# Patient Record
Sex: Male | Born: 1945 | Race: White | Hispanic: No | Marital: Married | Smoking: Never smoker
Health system: Southern US, Community
[De-identification: ages and names within clinical notes are randomized; demographics above are authoritative.]

## PROBLEM LIST (undated history)

## (undated) DIAGNOSIS — R7303 Prediabetes: Secondary | ICD-10-CM

## (undated) DIAGNOSIS — Z87898 Personal history of other specified conditions: Secondary | ICD-10-CM

## (undated) DIAGNOSIS — E7800 Pure hypercholesterolemia, unspecified: Secondary | ICD-10-CM

## (undated) DIAGNOSIS — N401 Enlarged prostate with lower urinary tract symptoms: Secondary | ICD-10-CM

## (undated) DIAGNOSIS — I10 Essential (primary) hypertension: Secondary | ICD-10-CM

## (undated) DIAGNOSIS — Z86010 Personal history of colonic polyps: Secondary | ICD-10-CM

## (undated) DIAGNOSIS — K5790 Diverticulosis of intestine, part unspecified, without perforation or abscess without bleeding: Secondary | ICD-10-CM

## (undated) DIAGNOSIS — H409 Unspecified glaucoma: Secondary | ICD-10-CM

## (undated) DIAGNOSIS — J4520 Mild intermittent asthma, uncomplicated: Secondary | ICD-10-CM

## (undated) DIAGNOSIS — K219 Gastro-esophageal reflux disease without esophagitis: Secondary | ICD-10-CM

## (undated) DIAGNOSIS — Z860101 Personal history of adenomatous and serrated colon polyps: Secondary | ICD-10-CM

## (undated) DIAGNOSIS — Z85820 Personal history of malignant melanoma of skin: Secondary | ICD-10-CM

## (undated) DIAGNOSIS — Z973 Presence of spectacles and contact lenses: Secondary | ICD-10-CM

## (undated) HISTORY — DX: Prediabetes: R73.03

## (undated) HISTORY — DX: Pure hypercholesterolemia, unspecified: E78.00

## (undated) HISTORY — DX: Essential (primary) hypertension: I10

## (undated) HISTORY — DX: Gastro-esophageal reflux disease without esophagitis: K21.9

## (undated) HISTORY — DX: Diverticulosis of intestine, part unspecified, without perforation or abscess without bleeding: K57.90

---

## 1996-08-23 DIAGNOSIS — Z85820 Personal history of malignant melanoma of skin: Secondary | ICD-10-CM

## 1996-08-23 HISTORY — DX: Personal history of malignant melanoma of skin: Z85.820

## 1996-08-23 HISTORY — PX: MELANOMA EXCISION WITH SENTINEL LYMPH NODE BIOPSY: SHX5267

## 1998-06-22 ENCOUNTER — Emergency Department (HOSPITAL_COMMUNITY): Admission: EM | Admit: 1998-06-22 | Discharge: 1998-06-22 | Payer: Self-pay | Admitting: Emergency Medicine

## 2000-05-16 ENCOUNTER — Encounter: Payer: Self-pay | Admitting: General Surgery

## 2000-05-16 ENCOUNTER — Encounter: Admission: RE | Admit: 2000-05-16 | Discharge: 2000-05-16 | Payer: Self-pay | Admitting: General Surgery

## 2001-05-26 ENCOUNTER — Encounter: Payer: Self-pay | Admitting: General Surgery

## 2001-05-26 ENCOUNTER — Encounter: Admission: RE | Admit: 2001-05-26 | Discharge: 2001-05-26 | Payer: Self-pay | Admitting: General Surgery

## 2002-05-28 ENCOUNTER — Encounter: Payer: Self-pay | Admitting: Family Medicine

## 2002-05-28 ENCOUNTER — Encounter: Admission: RE | Admit: 2002-05-28 | Discharge: 2002-05-28 | Payer: Self-pay | Admitting: Family Medicine

## 2003-12-13 ENCOUNTER — Ambulatory Visit (HOSPITAL_COMMUNITY): Admission: RE | Admit: 2003-12-13 | Discharge: 2003-12-13 | Payer: Self-pay | Admitting: Gastroenterology

## 2005-04-01 ENCOUNTER — Encounter: Admission: RE | Admit: 2005-04-01 | Discharge: 2005-04-01 | Payer: Self-pay | Admitting: Family Medicine

## 2006-05-16 ENCOUNTER — Encounter: Admission: RE | Admit: 2006-05-16 | Discharge: 2006-05-16 | Payer: Self-pay | Admitting: Family Medicine

## 2009-08-23 HISTORY — PX: COLONOSCOPY: SHX174

## 2011-12-20 DIAGNOSIS — H409 Unspecified glaucoma: Secondary | ICD-10-CM | POA: Diagnosis not present

## 2011-12-20 DIAGNOSIS — H4011X0 Primary open-angle glaucoma, stage unspecified: Secondary | ICD-10-CM | POA: Diagnosis not present

## 2011-12-30 DIAGNOSIS — H4011X0 Primary open-angle glaucoma, stage unspecified: Secondary | ICD-10-CM | POA: Diagnosis not present

## 2012-01-18 DIAGNOSIS — H409 Unspecified glaucoma: Secondary | ICD-10-CM | POA: Diagnosis not present

## 2012-01-18 DIAGNOSIS — H4011X0 Primary open-angle glaucoma, stage unspecified: Secondary | ICD-10-CM | POA: Diagnosis not present

## 2012-01-24 DIAGNOSIS — H4011X0 Primary open-angle glaucoma, stage unspecified: Secondary | ICD-10-CM | POA: Diagnosis not present

## 2012-02-03 DIAGNOSIS — H409 Unspecified glaucoma: Secondary | ICD-10-CM | POA: Diagnosis not present

## 2012-02-03 DIAGNOSIS — H4011X0 Primary open-angle glaucoma, stage unspecified: Secondary | ICD-10-CM | POA: Diagnosis not present

## 2012-02-23 DIAGNOSIS — H9190 Unspecified hearing loss, unspecified ear: Secondary | ICD-10-CM | POA: Diagnosis not present

## 2012-03-07 DIAGNOSIS — H1045 Other chronic allergic conjunctivitis: Secondary | ICD-10-CM | POA: Diagnosis not present

## 2012-03-07 DIAGNOSIS — H4011X0 Primary open-angle glaucoma, stage unspecified: Secondary | ICD-10-CM | POA: Diagnosis not present

## 2012-03-07 DIAGNOSIS — H01009 Unspecified blepharitis unspecified eye, unspecified eyelid: Secondary | ICD-10-CM | POA: Diagnosis not present

## 2012-03-07 DIAGNOSIS — H2510 Age-related nuclear cataract, unspecified eye: Secondary | ICD-10-CM | POA: Diagnosis not present

## 2012-03-16 DIAGNOSIS — L821 Other seborrheic keratosis: Secondary | ICD-10-CM | POA: Diagnosis not present

## 2012-03-16 DIAGNOSIS — L570 Actinic keratosis: Secondary | ICD-10-CM | POA: Diagnosis not present

## 2012-03-16 DIAGNOSIS — Z85820 Personal history of malignant melanoma of skin: Secondary | ICD-10-CM | POA: Diagnosis not present

## 2012-03-24 DIAGNOSIS — H409 Unspecified glaucoma: Secondary | ICD-10-CM | POA: Diagnosis not present

## 2012-03-24 DIAGNOSIS — Z125 Encounter for screening for malignant neoplasm of prostate: Secondary | ICD-10-CM | POA: Diagnosis not present

## 2012-03-24 DIAGNOSIS — Z0000 Encounter for general adult medical examination without abnormal findings: Secondary | ICD-10-CM | POA: Diagnosis not present

## 2012-03-24 DIAGNOSIS — Z79899 Other long term (current) drug therapy: Secondary | ICD-10-CM | POA: Diagnosis not present

## 2012-03-24 DIAGNOSIS — E7800 Pure hypercholesterolemia, unspecified: Secondary | ICD-10-CM | POA: Diagnosis not present

## 2012-03-24 DIAGNOSIS — I10 Essential (primary) hypertension: Secondary | ICD-10-CM | POA: Diagnosis not present

## 2012-03-24 DIAGNOSIS — R350 Frequency of micturition: Secondary | ICD-10-CM | POA: Diagnosis not present

## 2012-03-24 DIAGNOSIS — Z23 Encounter for immunization: Secondary | ICD-10-CM | POA: Diagnosis not present

## 2012-07-05 DIAGNOSIS — H4011X0 Primary open-angle glaucoma, stage unspecified: Secondary | ICD-10-CM | POA: Diagnosis not present

## 2012-07-05 DIAGNOSIS — H409 Unspecified glaucoma: Secondary | ICD-10-CM | POA: Diagnosis not present

## 2012-07-05 DIAGNOSIS — H2510 Age-related nuclear cataract, unspecified eye: Secondary | ICD-10-CM | POA: Diagnosis not present

## 2012-08-09 DIAGNOSIS — H4011X0 Primary open-angle glaucoma, stage unspecified: Secondary | ICD-10-CM | POA: Diagnosis not present

## 2012-08-31 DIAGNOSIS — H4011X0 Primary open-angle glaucoma, stage unspecified: Secondary | ICD-10-CM | POA: Diagnosis not present

## 2012-09-21 DIAGNOSIS — H1045 Other chronic allergic conjunctivitis: Secondary | ICD-10-CM | POA: Diagnosis not present

## 2012-09-21 DIAGNOSIS — D485 Neoplasm of uncertain behavior of skin: Secondary | ICD-10-CM | POA: Diagnosis not present

## 2012-09-21 DIAGNOSIS — H2510 Age-related nuclear cataract, unspecified eye: Secondary | ICD-10-CM | POA: Diagnosis not present

## 2012-09-21 DIAGNOSIS — Z85820 Personal history of malignant melanoma of skin: Secondary | ICD-10-CM | POA: Diagnosis not present

## 2012-09-21 DIAGNOSIS — H01009 Unspecified blepharitis unspecified eye, unspecified eyelid: Secondary | ICD-10-CM | POA: Diagnosis not present

## 2012-09-21 DIAGNOSIS — L570 Actinic keratosis: Secondary | ICD-10-CM | POA: Diagnosis not present

## 2012-09-21 DIAGNOSIS — H4011X0 Primary open-angle glaucoma, stage unspecified: Secondary | ICD-10-CM | POA: Diagnosis not present

## 2012-09-21 DIAGNOSIS — L821 Other seborrheic keratosis: Secondary | ICD-10-CM | POA: Diagnosis not present

## 2012-09-27 DIAGNOSIS — R350 Frequency of micturition: Secondary | ICD-10-CM | POA: Diagnosis not present

## 2012-09-27 DIAGNOSIS — R05 Cough: Secondary | ICD-10-CM | POA: Diagnosis not present

## 2012-09-27 DIAGNOSIS — E7800 Pure hypercholesterolemia, unspecified: Secondary | ICD-10-CM | POA: Diagnosis not present

## 2012-09-27 DIAGNOSIS — Z79899 Other long term (current) drug therapy: Secondary | ICD-10-CM | POA: Diagnosis not present

## 2012-09-27 DIAGNOSIS — I10 Essential (primary) hypertension: Secondary | ICD-10-CM | POA: Diagnosis not present

## 2012-09-27 DIAGNOSIS — H9209 Otalgia, unspecified ear: Secondary | ICD-10-CM | POA: Diagnosis not present

## 2012-10-12 DIAGNOSIS — R7301 Impaired fasting glucose: Secondary | ICD-10-CM | POA: Diagnosis not present

## 2013-01-19 DIAGNOSIS — H4011X0 Primary open-angle glaucoma, stage unspecified: Secondary | ICD-10-CM | POA: Diagnosis not present

## 2013-01-19 DIAGNOSIS — H2510 Age-related nuclear cataract, unspecified eye: Secondary | ICD-10-CM | POA: Diagnosis not present

## 2013-01-19 DIAGNOSIS — H01009 Unspecified blepharitis unspecified eye, unspecified eyelid: Secondary | ICD-10-CM | POA: Diagnosis not present

## 2013-03-21 DIAGNOSIS — Z85828 Personal history of other malignant neoplasm of skin: Secondary | ICD-10-CM | POA: Diagnosis not present

## 2013-03-21 DIAGNOSIS — L821 Other seborrheic keratosis: Secondary | ICD-10-CM | POA: Diagnosis not present

## 2013-03-21 DIAGNOSIS — D1801 Hemangioma of skin and subcutaneous tissue: Secondary | ICD-10-CM | POA: Diagnosis not present

## 2013-03-21 DIAGNOSIS — L570 Actinic keratosis: Secondary | ICD-10-CM | POA: Diagnosis not present

## 2013-03-21 DIAGNOSIS — Z85820 Personal history of malignant melanoma of skin: Secondary | ICD-10-CM | POA: Diagnosis not present

## 2013-03-27 DIAGNOSIS — E7800 Pure hypercholesterolemia, unspecified: Secondary | ICD-10-CM | POA: Diagnosis not present

## 2013-03-27 DIAGNOSIS — Z125 Encounter for screening for malignant neoplasm of prostate: Secondary | ICD-10-CM | POA: Diagnosis not present

## 2013-03-27 DIAGNOSIS — Z79899 Other long term (current) drug therapy: Secondary | ICD-10-CM | POA: Diagnosis not present

## 2013-03-27 DIAGNOSIS — R7309 Other abnormal glucose: Secondary | ICD-10-CM | POA: Diagnosis not present

## 2013-03-27 DIAGNOSIS — Z0000 Encounter for general adult medical examination without abnormal findings: Secondary | ICD-10-CM | POA: Diagnosis not present

## 2013-03-27 DIAGNOSIS — I10 Essential (primary) hypertension: Secondary | ICD-10-CM | POA: Diagnosis not present

## 2013-03-27 DIAGNOSIS — Z86010 Personal history of colonic polyps: Secondary | ICD-10-CM | POA: Diagnosis not present

## 2013-05-30 DIAGNOSIS — H2510 Age-related nuclear cataract, unspecified eye: Secondary | ICD-10-CM | POA: Diagnosis not present

## 2013-05-30 DIAGNOSIS — H4011X0 Primary open-angle glaucoma, stage unspecified: Secondary | ICD-10-CM | POA: Diagnosis not present

## 2013-07-16 ENCOUNTER — Ambulatory Visit
Admission: RE | Admit: 2013-07-16 | Discharge: 2013-07-16 | Disposition: A | Discharge status: Home / Self Care | Payer: Medicare Other | Source: Ambulatory Visit | Attending: Family Medicine | Admitting: Family Medicine

## 2013-07-16 ENCOUNTER — Other Ambulatory Visit: Payer: Self-pay | Admitting: Family Medicine

## 2013-07-16 DIAGNOSIS — R05 Cough: Secondary | ICD-10-CM

## 2013-07-30 DIAGNOSIS — I10 Essential (primary) hypertension: Secondary | ICD-10-CM | POA: Diagnosis not present

## 2013-07-30 DIAGNOSIS — R05 Cough: Secondary | ICD-10-CM | POA: Diagnosis not present

## 2013-08-10 DIAGNOSIS — I10 Essential (primary) hypertension: Secondary | ICD-10-CM | POA: Diagnosis not present

## 2013-08-10 DIAGNOSIS — R05 Cough: Secondary | ICD-10-CM | POA: Diagnosis not present

## 2013-09-20 DIAGNOSIS — L570 Actinic keratosis: Secondary | ICD-10-CM | POA: Diagnosis not present

## 2013-09-20 DIAGNOSIS — Z85828 Personal history of other malignant neoplasm of skin: Secondary | ICD-10-CM | POA: Diagnosis not present

## 2013-09-20 DIAGNOSIS — L820 Inflamed seborrheic keratosis: Secondary | ICD-10-CM | POA: Diagnosis not present

## 2013-09-20 DIAGNOSIS — D1739 Benign lipomatous neoplasm of skin and subcutaneous tissue of other sites: Secondary | ICD-10-CM | POA: Diagnosis not present

## 2013-09-20 DIAGNOSIS — L821 Other seborrheic keratosis: Secondary | ICD-10-CM | POA: Diagnosis not present

## 2013-09-20 DIAGNOSIS — D1801 Hemangioma of skin and subcutaneous tissue: Secondary | ICD-10-CM | POA: Diagnosis not present

## 2013-09-20 DIAGNOSIS — Z85820 Personal history of malignant melanoma of skin: Secondary | ICD-10-CM | POA: Diagnosis not present

## 2013-10-01 DIAGNOSIS — R7309 Other abnormal glucose: Secondary | ICD-10-CM | POA: Diagnosis not present

## 2013-10-01 DIAGNOSIS — H409 Unspecified glaucoma: Secondary | ICD-10-CM | POA: Diagnosis not present

## 2013-10-01 DIAGNOSIS — H9209 Otalgia, unspecified ear: Secondary | ICD-10-CM | POA: Diagnosis not present

## 2013-10-01 DIAGNOSIS — Z79899 Other long term (current) drug therapy: Secondary | ICD-10-CM | POA: Diagnosis not present

## 2013-10-01 DIAGNOSIS — E7800 Pure hypercholesterolemia, unspecified: Secondary | ICD-10-CM | POA: Diagnosis not present

## 2013-10-01 DIAGNOSIS — K219 Gastro-esophageal reflux disease without esophagitis: Secondary | ICD-10-CM | POA: Diagnosis not present

## 2013-10-01 DIAGNOSIS — I10 Essential (primary) hypertension: Secondary | ICD-10-CM | POA: Diagnosis not present

## 2013-10-05 DIAGNOSIS — H4011X0 Primary open-angle glaucoma, stage unspecified: Secondary | ICD-10-CM | POA: Diagnosis not present

## 2013-11-16 DIAGNOSIS — H2510 Age-related nuclear cataract, unspecified eye: Secondary | ICD-10-CM | POA: Diagnosis not present

## 2013-11-16 DIAGNOSIS — H4011X0 Primary open-angle glaucoma, stage unspecified: Secondary | ICD-10-CM | POA: Diagnosis not present

## 2013-12-14 DIAGNOSIS — H4011X0 Primary open-angle glaucoma, stage unspecified: Secondary | ICD-10-CM | POA: Diagnosis not present

## 2014-01-11 DIAGNOSIS — H2510 Age-related nuclear cataract, unspecified eye: Secondary | ICD-10-CM | POA: Diagnosis not present

## 2014-01-11 DIAGNOSIS — H4011X0 Primary open-angle glaucoma, stage unspecified: Secondary | ICD-10-CM | POA: Diagnosis not present

## 2014-03-27 DIAGNOSIS — Z85828 Personal history of other malignant neoplasm of skin: Secondary | ICD-10-CM | POA: Diagnosis not present

## 2014-03-27 DIAGNOSIS — L570 Actinic keratosis: Secondary | ICD-10-CM | POA: Diagnosis not present

## 2014-03-27 DIAGNOSIS — L678 Other hair color and hair shaft abnormalities: Secondary | ICD-10-CM | POA: Diagnosis not present

## 2014-03-27 DIAGNOSIS — D1739 Benign lipomatous neoplasm of skin and subcutaneous tissue of other sites: Secondary | ICD-10-CM | POA: Diagnosis not present

## 2014-03-27 DIAGNOSIS — L738 Other specified follicular disorders: Secondary | ICD-10-CM | POA: Diagnosis not present

## 2014-03-27 DIAGNOSIS — L821 Other seborrheic keratosis: Secondary | ICD-10-CM | POA: Diagnosis not present

## 2014-03-27 DIAGNOSIS — Q828 Other specified congenital malformations of skin: Secondary | ICD-10-CM | POA: Diagnosis not present

## 2014-03-27 DIAGNOSIS — Z85820 Personal history of malignant melanoma of skin: Secondary | ICD-10-CM | POA: Diagnosis not present

## 2014-04-22 DIAGNOSIS — I10 Essential (primary) hypertension: Secondary | ICD-10-CM | POA: Diagnosis not present

## 2014-04-22 DIAGNOSIS — E7800 Pure hypercholesterolemia, unspecified: Secondary | ICD-10-CM | POA: Diagnosis not present

## 2014-04-22 DIAGNOSIS — H409 Unspecified glaucoma: Secondary | ICD-10-CM | POA: Diagnosis not present

## 2014-04-22 DIAGNOSIS — R7309 Other abnormal glucose: Secondary | ICD-10-CM | POA: Diagnosis not present

## 2014-04-22 DIAGNOSIS — Z1331 Encounter for screening for depression: Secondary | ICD-10-CM | POA: Diagnosis not present

## 2014-04-22 DIAGNOSIS — K219 Gastro-esophageal reflux disease without esophagitis: Secondary | ICD-10-CM | POA: Diagnosis not present

## 2014-05-14 DIAGNOSIS — H4011X0 Primary open-angle glaucoma, stage unspecified: Secondary | ICD-10-CM | POA: Diagnosis not present

## 2014-05-14 DIAGNOSIS — H2510 Age-related nuclear cataract, unspecified eye: Secondary | ICD-10-CM | POA: Diagnosis not present

## 2014-10-01 DIAGNOSIS — H2513 Age-related nuclear cataract, bilateral: Secondary | ICD-10-CM | POA: Diagnosis not present

## 2014-10-01 DIAGNOSIS — H4011X2 Primary open-angle glaucoma, moderate stage: Secondary | ICD-10-CM | POA: Diagnosis not present

## 2014-10-01 DIAGNOSIS — H4011X3 Primary open-angle glaucoma, severe stage: Secondary | ICD-10-CM | POA: Diagnosis not present

## 2014-10-01 DIAGNOSIS — H40033 Anatomical narrow angle, bilateral: Secondary | ICD-10-CM | POA: Diagnosis not present

## 2014-10-03 DIAGNOSIS — L570 Actinic keratosis: Secondary | ICD-10-CM | POA: Diagnosis not present

## 2014-10-03 DIAGNOSIS — Z85820 Personal history of malignant melanoma of skin: Secondary | ICD-10-CM | POA: Diagnosis not present

## 2014-10-03 DIAGNOSIS — Z85828 Personal history of other malignant neoplasm of skin: Secondary | ICD-10-CM | POA: Diagnosis not present

## 2014-10-03 DIAGNOSIS — L821 Other seborrheic keratosis: Secondary | ICD-10-CM | POA: Diagnosis not present

## 2014-10-03 DIAGNOSIS — D1721 Benign lipomatous neoplasm of skin and subcutaneous tissue of right arm: Secondary | ICD-10-CM | POA: Diagnosis not present

## 2014-10-22 DIAGNOSIS — H409 Unspecified glaucoma: Secondary | ICD-10-CM | POA: Diagnosis not present

## 2014-10-22 DIAGNOSIS — R7309 Other abnormal glucose: Secondary | ICD-10-CM | POA: Diagnosis not present

## 2014-10-22 DIAGNOSIS — K219 Gastro-esophageal reflux disease without esophagitis: Secondary | ICD-10-CM | POA: Diagnosis not present

## 2014-10-22 DIAGNOSIS — E780 Pure hypercholesterolemia: Secondary | ICD-10-CM | POA: Diagnosis not present

## 2014-10-22 DIAGNOSIS — H6120 Impacted cerumen, unspecified ear: Secondary | ICD-10-CM | POA: Diagnosis not present

## 2014-10-22 DIAGNOSIS — I10 Essential (primary) hypertension: Secondary | ICD-10-CM | POA: Diagnosis not present

## 2014-10-22 DIAGNOSIS — Z0000 Encounter for general adult medical examination without abnormal findings: Secondary | ICD-10-CM | POA: Diagnosis not present

## 2014-10-22 DIAGNOSIS — Z86010 Personal history of colonic polyps: Secondary | ICD-10-CM | POA: Diagnosis not present

## 2014-10-22 DIAGNOSIS — Z125 Encounter for screening for malignant neoplasm of prostate: Secondary | ICD-10-CM | POA: Diagnosis not present

## 2014-10-22 DIAGNOSIS — Z23 Encounter for immunization: Secondary | ICD-10-CM | POA: Diagnosis not present

## 2014-10-22 DIAGNOSIS — Z1389 Encounter for screening for other disorder: Secondary | ICD-10-CM | POA: Diagnosis not present

## 2015-01-30 DIAGNOSIS — H4011X3 Primary open-angle glaucoma, severe stage: Secondary | ICD-10-CM | POA: Diagnosis not present

## 2015-01-30 DIAGNOSIS — H2513 Age-related nuclear cataract, bilateral: Secondary | ICD-10-CM | POA: Diagnosis not present

## 2015-04-15 DIAGNOSIS — L570 Actinic keratosis: Secondary | ICD-10-CM | POA: Diagnosis not present

## 2015-04-15 DIAGNOSIS — L821 Other seborrheic keratosis: Secondary | ICD-10-CM | POA: Diagnosis not present

## 2015-04-15 DIAGNOSIS — D1721 Benign lipomatous neoplasm of skin and subcutaneous tissue of right arm: Secondary | ICD-10-CM | POA: Diagnosis not present

## 2015-04-15 DIAGNOSIS — D1801 Hemangioma of skin and subcutaneous tissue: Secondary | ICD-10-CM | POA: Diagnosis not present

## 2015-04-15 DIAGNOSIS — Z85820 Personal history of malignant melanoma of skin: Secondary | ICD-10-CM | POA: Diagnosis not present

## 2015-04-15 DIAGNOSIS — Z85828 Personal history of other malignant neoplasm of skin: Secondary | ICD-10-CM | POA: Diagnosis not present

## 2015-05-08 DIAGNOSIS — I10 Essential (primary) hypertension: Secondary | ICD-10-CM | POA: Diagnosis not present

## 2015-05-08 DIAGNOSIS — R7309 Other abnormal glucose: Secondary | ICD-10-CM | POA: Diagnosis not present

## 2015-05-08 DIAGNOSIS — E780 Pure hypercholesterolemia: Secondary | ICD-10-CM | POA: Diagnosis not present

## 2015-05-08 DIAGNOSIS — Z86010 Personal history of colonic polyps: Secondary | ICD-10-CM | POA: Diagnosis not present

## 2015-05-08 DIAGNOSIS — K219 Gastro-esophageal reflux disease without esophagitis: Secondary | ICD-10-CM | POA: Diagnosis not present

## 2015-05-08 DIAGNOSIS — H409 Unspecified glaucoma: Secondary | ICD-10-CM | POA: Diagnosis not present

## 2015-06-03 DIAGNOSIS — H401123 Primary open-angle glaucoma, left eye, severe stage: Secondary | ICD-10-CM | POA: Diagnosis not present

## 2015-06-03 DIAGNOSIS — H401112 Primary open-angle glaucoma, right eye, moderate stage: Secondary | ICD-10-CM | POA: Diagnosis not present

## 2015-07-11 DIAGNOSIS — Z86010 Personal history of colonic polyps: Secondary | ICD-10-CM | POA: Diagnosis not present

## 2015-07-11 DIAGNOSIS — K648 Other hemorrhoids: Secondary | ICD-10-CM | POA: Diagnosis not present

## 2015-07-11 DIAGNOSIS — Z09 Encounter for follow-up examination after completed treatment for conditions other than malignant neoplasm: Secondary | ICD-10-CM | POA: Diagnosis not present

## 2015-10-08 DIAGNOSIS — H401112 Primary open-angle glaucoma, right eye, moderate stage: Secondary | ICD-10-CM | POA: Diagnosis not present

## 2015-10-08 DIAGNOSIS — H2513 Age-related nuclear cataract, bilateral: Secondary | ICD-10-CM | POA: Diagnosis not present

## 2015-10-08 DIAGNOSIS — H401123 Primary open-angle glaucoma, left eye, severe stage: Secondary | ICD-10-CM | POA: Diagnosis not present

## 2015-10-28 DIAGNOSIS — L570 Actinic keratosis: Secondary | ICD-10-CM | POA: Diagnosis not present

## 2015-10-28 DIAGNOSIS — Z85828 Personal history of other malignant neoplasm of skin: Secondary | ICD-10-CM | POA: Diagnosis not present

## 2015-10-28 DIAGNOSIS — D485 Neoplasm of uncertain behavior of skin: Secondary | ICD-10-CM | POA: Diagnosis not present

## 2015-10-28 DIAGNOSIS — D1801 Hemangioma of skin and subcutaneous tissue: Secondary | ICD-10-CM | POA: Diagnosis not present

## 2015-10-28 DIAGNOSIS — L821 Other seborrheic keratosis: Secondary | ICD-10-CM | POA: Diagnosis not present

## 2015-10-28 DIAGNOSIS — Z85820 Personal history of malignant melanoma of skin: Secondary | ICD-10-CM | POA: Diagnosis not present

## 2015-10-28 DIAGNOSIS — L820 Inflamed seborrheic keratosis: Secondary | ICD-10-CM | POA: Diagnosis not present

## 2015-10-28 DIAGNOSIS — D1721 Benign lipomatous neoplasm of skin and subcutaneous tissue of right arm: Secondary | ICD-10-CM | POA: Diagnosis not present

## 2015-11-05 DIAGNOSIS — R7303 Prediabetes: Secondary | ICD-10-CM | POA: Diagnosis not present

## 2015-11-05 DIAGNOSIS — H409 Unspecified glaucoma: Secondary | ICD-10-CM | POA: Diagnosis not present

## 2015-11-05 DIAGNOSIS — E7800 Pure hypercholesterolemia, unspecified: Secondary | ICD-10-CM | POA: Diagnosis not present

## 2015-11-05 DIAGNOSIS — I10 Essential (primary) hypertension: Secondary | ICD-10-CM | POA: Diagnosis not present

## 2015-11-05 DIAGNOSIS — R7309 Other abnormal glucose: Secondary | ICD-10-CM | POA: Diagnosis not present

## 2015-11-05 DIAGNOSIS — K219 Gastro-esophageal reflux disease without esophagitis: Secondary | ICD-10-CM | POA: Diagnosis not present

## 2015-12-12 ENCOUNTER — Encounter: Payer: Self-pay | Admitting: Podiatry

## 2015-12-12 ENCOUNTER — Ambulatory Visit (INDEPENDENT_AMBULATORY_CARE_PROVIDER_SITE_OTHER): Payer: Medicare Other | Admitting: Podiatry

## 2015-12-12 VITALS — BP 181/103 | HR 72 | Resp 14

## 2015-12-12 DIAGNOSIS — M79676 Pain in unspecified toe(s): Secondary | ICD-10-CM | POA: Diagnosis not present

## 2015-12-12 DIAGNOSIS — W450XXA Nail entering through skin, initial encounter: Secondary | ICD-10-CM | POA: Diagnosis not present

## 2015-12-12 NOTE — Progress Notes (Signed)
   Subjective:    Patient ID: Spencer Lloyd, male    DOB: 09-14-1945, 70 y.o.   MRN: [id number]  HPI this patient presents to the office with chief complaint of a painful left big toenail, left foot. He says the pain has been present on and off for the last 2 months. He states he has tried to treat the nail himself. It was a fungus. He has use Listerine and applied Listerine to the nail with no improvement. He says the nail has thickened over these 2 months, but has pain along the inside border of the big toe of the left foot. He presents to the office for an evaluation and treatment    Review of Systems  Genitourinary: Positive for frequency.       Objective:   Physical Exam GENERAL APPEARANCE: Alert, conversant. Appropriately groomed. No acute distress.  VASCULAR: Pedal pulses are  palpable at  [facility] and PT bilateral.  Capillary refill time is immediate to all digits,  Normal temperature gradient.  Digital hair growth is present bilateral  NEUROLOGIC: sensation is normal to 5.07 monofilament at 5/5 sites bilateral.  Light touch is intact bilateral, Muscle strength normal.  MUSCULOSKELETAL: acceptable muscle strength, tone and stability bilateral.  Intrinsic muscluature intact bilateral.  Rectus appearance of foot and digits noted bilateral.   DERMATOLOGIC: skin color, texture, and turgor are within normal limits.  No preulcerative lesions or ulcers  are seen, no interdigital maceration noted.  No open lesions present.  . No drainage noted.  NAILS  thick disfigured hallux toenail with the distal aspect of the nail unattached. There is marked. Full-thickness noted along the medial border of the big toe left foot. No redness, swelling noted along the medial border of the left great toe         Assessment & Plan:  Nail, injured left hallux in absence of infection.  IE  Debridement of left hallux nail. Discussed conservative vs. Surgical nail correction.  RTC 3 months.   Gardiner Barefoot  DPM

## 2016-02-03 DIAGNOSIS — H9209 Otalgia, unspecified ear: Secondary | ICD-10-CM | POA: Diagnosis not present

## 2016-02-19 DIAGNOSIS — H401112 Primary open-angle glaucoma, right eye, moderate stage: Secondary | ICD-10-CM | POA: Diagnosis not present

## 2016-02-19 DIAGNOSIS — H2513 Age-related nuclear cataract, bilateral: Secondary | ICD-10-CM | POA: Diagnosis not present

## 2016-02-19 DIAGNOSIS — H401123 Primary open-angle glaucoma, left eye, severe stage: Secondary | ICD-10-CM | POA: Diagnosis not present

## 2016-03-12 ENCOUNTER — Ambulatory Visit (INDEPENDENT_AMBULATORY_CARE_PROVIDER_SITE_OTHER): Payer: Medicare Other | Admitting: Podiatry

## 2016-03-12 ENCOUNTER — Encounter: Payer: Self-pay | Admitting: Podiatry

## 2016-03-12 DIAGNOSIS — M79676 Pain in unspecified toe(s): Secondary | ICD-10-CM | POA: Diagnosis not present

## 2016-03-12 DIAGNOSIS — B351 Tinea unguium: Secondary | ICD-10-CM | POA: Diagnosis not present

## 2016-03-12 NOTE — Progress Notes (Signed)
Patient ID: Spencer Lloyd, male   DOB: [birth year], 70 y.o.   MRN: [id number] Complaint:  Visit Type: Patient returns to my office for continued preventative foot care services. Complaint: Patient states" my nails have grown long and thick and become painful to walk and wear shoes" . The patient presents for preventative foot care services. No changes to ROS  Podiatric Exam: Vascular: dorsalis pedis and posterior tibial pulses are palpable bilateral. Capillary return is immediate. Temperature gradient is WNL. Skin turgor WNL  Sensorium: Normal Semmes Weinstein monofilament test. Normal tactile sensation bilaterally. Nail Exam: Pt has thick disfigured discolored nails with subungual debris noted bilateral entire nail hallux . Ulcer Exam: There is no evidence of ulcer or pre-ulcerative changes or infection. Orthopedic Exam: Muscle tone and strength are WNL. No limitations in general ROM. No crepitus or effusions noted. Foot type and digits show no abnormalities. Bony prominences are unremarkable. Skin: No Porokeratosis. No infection or ulcers  Diagnosis:  Onychomycosis, , Pain in right toe, pain in left toes  Treatment & Plan Procedures and Treatment: Consent by patient was obtained for treatment procedures. The patient understood the discussion of treatment and procedures well. All questions were answered thoroughly reviewed. Debridement of mycotic and hypertrophic toenails, 1 through 5 bilateral and clearing of subungual debris. No ulceration, no infection noted.  Return Visit-Office Procedure: Patient instructed to return to the office for a follow up visit 4 months for continued evaluation and treatment.    Gardiner Barefoot DPM

## 2016-03-22 DIAGNOSIS — Z85828 Personal history of other malignant neoplasm of skin: Secondary | ICD-10-CM | POA: Diagnosis not present

## 2016-03-22 DIAGNOSIS — L570 Actinic keratosis: Secondary | ICD-10-CM | POA: Diagnosis not present

## 2016-03-22 DIAGNOSIS — L821 Other seborrheic keratosis: Secondary | ICD-10-CM | POA: Diagnosis not present

## 2016-03-22 DIAGNOSIS — Z85820 Personal history of malignant melanoma of skin: Secondary | ICD-10-CM | POA: Diagnosis not present

## 2016-05-13 DIAGNOSIS — L570 Actinic keratosis: Secondary | ICD-10-CM | POA: Diagnosis not present

## 2016-05-13 DIAGNOSIS — D1801 Hemangioma of skin and subcutaneous tissue: Secondary | ICD-10-CM | POA: Diagnosis not present

## 2016-05-13 DIAGNOSIS — D1721 Benign lipomatous neoplasm of skin and subcutaneous tissue of right arm: Secondary | ICD-10-CM | POA: Diagnosis not present

## 2016-05-13 DIAGNOSIS — Z85828 Personal history of other malignant neoplasm of skin: Secondary | ICD-10-CM | POA: Diagnosis not present

## 2016-05-13 DIAGNOSIS — L821 Other seborrheic keratosis: Secondary | ICD-10-CM | POA: Diagnosis not present

## 2016-05-13 DIAGNOSIS — D485 Neoplasm of uncertain behavior of skin: Secondary | ICD-10-CM | POA: Diagnosis not present

## 2016-05-13 DIAGNOSIS — C4441 Basal cell carcinoma of skin of scalp and neck: Secondary | ICD-10-CM | POA: Diagnosis not present

## 2016-05-13 DIAGNOSIS — Z85820 Personal history of malignant melanoma of skin: Secondary | ICD-10-CM | POA: Diagnosis not present

## 2016-05-20 DIAGNOSIS — R7303 Prediabetes: Secondary | ICD-10-CM | POA: Diagnosis not present

## 2016-05-20 DIAGNOSIS — Z0000 Encounter for general adult medical examination without abnormal findings: Secondary | ICD-10-CM | POA: Diagnosis not present

## 2016-05-20 DIAGNOSIS — I10 Essential (primary) hypertension: Secondary | ICD-10-CM | POA: Diagnosis not present

## 2016-05-20 DIAGNOSIS — Z85828 Personal history of other malignant neoplasm of skin: Secondary | ICD-10-CM | POA: Diagnosis not present

## 2016-05-20 DIAGNOSIS — Z86010 Personal history of colonic polyps: Secondary | ICD-10-CM | POA: Diagnosis not present

## 2016-05-20 DIAGNOSIS — H409 Unspecified glaucoma: Secondary | ICD-10-CM | POA: Diagnosis not present

## 2016-05-20 DIAGNOSIS — Z85820 Personal history of malignant melanoma of skin: Secondary | ICD-10-CM | POA: Diagnosis not present

## 2016-05-20 DIAGNOSIS — E7800 Pure hypercholesterolemia, unspecified: Secondary | ICD-10-CM | POA: Diagnosis not present

## 2016-05-20 DIAGNOSIS — K219 Gastro-esophageal reflux disease without esophagitis: Secondary | ICD-10-CM | POA: Diagnosis not present

## 2016-05-26 DIAGNOSIS — H401112 Primary open-angle glaucoma, right eye, moderate stage: Secondary | ICD-10-CM | POA: Diagnosis not present

## 2016-05-26 DIAGNOSIS — H401123 Primary open-angle glaucoma, left eye, severe stage: Secondary | ICD-10-CM | POA: Diagnosis not present

## 2016-05-26 DIAGNOSIS — H2513 Age-related nuclear cataract, bilateral: Secondary | ICD-10-CM | POA: Diagnosis not present

## 2016-06-15 ENCOUNTER — Ambulatory Visit (INDEPENDENT_AMBULATORY_CARE_PROVIDER_SITE_OTHER): Payer: Medicare Other | Admitting: Physician Assistant

## 2016-06-15 ENCOUNTER — Ambulatory Visit (INDEPENDENT_AMBULATORY_CARE_PROVIDER_SITE_OTHER): Payer: Medicare Other

## 2016-06-15 VITALS — BP 126/82 | HR 62 | Temp 98.1°F | Resp 18 | Ht 67.0 in | Wt 168.0 lb

## 2016-06-15 DIAGNOSIS — W19XXXA Unspecified fall, initial encounter: Secondary | ICD-10-CM

## 2016-06-15 DIAGNOSIS — Z23 Encounter for immunization: Secondary | ICD-10-CM

## 2016-06-15 DIAGNOSIS — M25522 Pain in left elbow: Secondary | ICD-10-CM

## 2016-06-15 DIAGNOSIS — S51012A Laceration without foreign body of left elbow, initial encounter: Secondary | ICD-10-CM | POA: Diagnosis not present

## 2016-06-15 MED ORDER — CEPHALEXIN 500 MG PO CAPS: 500.0000 mg | ORAL_CAPSULE | Freq: Three times a day (TID) | ORAL | 0 refills | Status: DC

## 2016-06-15 NOTE — Progress Notes (Addendum)
By signing my name below, I, Spencer Lloyd, attest that this documentation has been prepared under the direction and in the presence of Philis Fendt, PA-C. Electronically Signed: Moises Lloyd, Scribe. 06/15/2016 , 2:30 PM .  Patient was seen in Room 6 .  06/15/2016 3:04 PM   DOB: 02-10-1946 / MRN: [id number]  SUBJECTIVE:  Spencer Lloyd is a 70 y.o. male presenting for sudden onset of laceration over left elbow that occurred after a fall today. Patient states that he was sitting on a stool and fell off onto concrete of his his driveway. Initially, he thought he may have broken his arm. He did feel some dizziness and nausea so he laid on his driveway. But afterwards, he continued washing his truck for another 2 hours. He got into the shower and noticed the cut. He denies change in sensation, focal weakness or paresthesia. He denies knowledge of last tetanus shot. He denies hitting his head or LOC. He denies knowledge of any antibiotic allergies.   He is a new patient to [place]. He notes his wife is a patient here. He was seen by his doctor recently.   He has No Known Allergies.   He  has no past medical history on file.    He  reports that he has never smoked. He has never used smokeless tobacco. He reports that he drinks alcohol. He reports that he does not use drugs. He  has no sexual activity history on file. The patient  has no past surgical history on file.  His family history is not on file.  Review of Systems  Constitutional: Negative for fever.  Skin:       Laceration across left arm  Neurological: Negative for dizziness, sensory change, focal weakness and loss of consciousness.    The problem list and medications were reviewed and updated by myself where necessary and exist elsewhere in the encounter.   OBJECTIVE:  BP 126/82 (BP Location: Right Arm, Patient Position: Sitting, Cuff Size: Small)   Pulse 62   Temp 98.1 F (36.7 C) (Oral)   Resp 18   Ht 5\' 7"  (1.702 m)    Wt 168 lb (76.2 kg)   SpO2 98%   BMI 26.31 kg/m   Physical Exam  Constitutional: He is oriented to person, place, and time. Vital signs are normal. He appears well-developed.  Cardiovascular: Normal rate.   Pulmonary/Chest: Effort normal. No respiratory distress.  Musculoskeletal: Normal range of motion.  Neurological: He is alert and oriented to person, place, and time.  Skin: Skin is warm and dry.  Psychiatric: He has a normal mood and affect. His behavior is normal. Judgment and thought content normal.    No results found for this or any previous visit (from the past 72 hour(s)).  Dg Elbow 2 Views Left  Result Date: 06/15/2016 CLINICAL DATA:  Fall.  Elbow laceration. EXAM: LEFT ELBOW - 2 VIEW COMPARISON:  None. FINDINGS: There is no evidence of fracture, dislocation, or joint effusion. There is no evidence of arthropathy or other focal bone abnormality. Soft tissues are unremarkable. IMPRESSION: Negative. Electronically Signed   By: Ilona Sorrel M.D.   On: 06/15/2016 14:47       Procedure:  Patient verbal consent. 2% lidocaine with epi, 4cc total. Wound cleansed with normal saline, wound repaired with 3-0 prolene using 11 horizontal mattress. Patient tolerated procedure without difficulty.   ASSESSMENT AND PLAN  [name] was seen today for arm injury.  Diagnoses and all orders for  this visit:  Left Elbow Pain: See problem 2.   Elbow laceration, left, initial encounter: Repaired with excellent approximation.  Rads negative.  RTC in 10 days for removal, sooner if any problems.  -     cephALEXin (KEFLEX) 500 MG capsule; Take 1 capsule (500 mg total) by mouth 3 (three) times daily.  Fall, initial encounter -     DG Elbow 2 Views Left; Future  Need for Tdap vaccination -     Tdap vaccine greater than or equal to 7yo IM     The patient is advised to call or return to clinic if he does not see an improvement in symptoms, or to seek the care of the closest emergency  department if he worsens with the above plan.   This note was scribed in my presence and I performed the services described in the this documentation.   Philis Fendt, MHS, PA-C [facility] 06/15/2016 3:04 PM

## 2016-06-15 NOTE — Patient Instructions (Addendum)
IF you received an x-ray today, you will receive an invoice from [facility]. Please contact [facility] at [phone] with questions or concerns regarding your invoice.   IF you received labwork today, you will receive an invoice from [employer]. Please contact [facility] at [phone] with questions or concerns regarding your invoice.   Our billing staff will not be able to assist you with questions regarding bills from these companies.  You will be contacted with the lab results as soon as they are available. The fastest way to get your results is to activate your My Chart account. Instructions are located on the last page of this paperwork. If you have not heard from [place] regarding the results in 2 weeks, please contact this office.     Laceration Care, Adult A laceration is a cut that goes through all of the layers of the skin and into the tissue that is right under the skin. Some lacerations heal on their own. Others need to be closed with stitches (sutures), staples, skin adhesive strips, or skin glue. Proper laceration care minimizes the risk of infection and helps the laceration to heal better. HOW TO CARE FOR YOUR LACERATION If sutures or staples were used:  Keep the wound clean and dry.  If you were given a bandage (dressing), you should change it at least one time per day or as told by your health care provider. You should also change it if it becomes wet or dirty.  Keep the wound completely dry for the first 24 hours or as told by your health care provider. After that time, you may shower or bathe. However, make sure that the wound is not soaked in water until after the sutures or staples have been removed.  Clean the wound one time each day or as told by your health care provider:  Wash the wound with soap and water.  Rinse the wound with water to remove all soap.  Pat the wound dry with a clean towel. Do not rub the  wound.  After cleaning the wound, apply a thin layer of antibiotic ointmentas told by your health care provider. This will help to prevent infection and keep the dressing from sticking to the wound.  Have the sutures or staples removed as told by your health care provider. If skin adhesive strips were used:  Keep the wound clean and dry.  If you were given a bandage (dressing), you should change it at least one time per day or as told by your health care provider. You should also change it if it becomes dirty or wet.  Do not get the skin adhesive strips wet. You may shower or bathe, but be careful to keep the wound dry.  If the wound gets wet, pat it dry with a clean towel. Do not rub the wound.  Skin adhesive strips fall off on their own. You may trim the strips as the wound heals. Do not remove skin adhesive strips that are still stuck to the wound. They will fall off in time. If skin glue was used:  Try to keep the wound dry, but you may briefly wet it in the shower or bath. Do not soak the wound in water, such as by swimming.  After you have showered or bathed, gently pat the wound dry with a clean towel. Do not rub the wound.  Do not do any activities that will make you sweat heavily until the skin glue has  fallen off on its own.  Do not apply liquid, cream, or ointment medicine to the wound while the skin glue is in place. Using those may loosen the film before the wound has healed.  If you were given a bandage (dressing), you should change it at least one time per day or as told by your health care provider. You should also change it if it becomes dirty or wet.  If a dressing is placed over the wound, be careful not to apply tape directly over the skin glue. Doing that may cause the glue to be pulled off before the wound has healed.  Do not pick at the glue. The skin glue usually remains in place for 5-10 days, then it falls off of the skin. General Instructions  Take  over-the-counter and prescription medicines only as told by your health care provider.  If you were prescribed an antibiotic medicine or ointment, take or apply it as told by your doctor. Do not stop using it even if your condition improves.  To help prevent scarring, make sure to cover your wound with sunscreen whenever you are outside after stitches are removed, after adhesive strips are removed, or when glue remains in place and the wound is healed. Make sure to wear a sunscreen of at least 30 SPF.  Do not scratch or pick at the wound.  Keep all follow-up visits as told by your health care provider. This is important.  Check your wound every day for signs of infection. Watch for:  Redness, swelling, or pain.  Fluid, blood, or pus.  Raise (elevate) the injured area above the level of your heart while you are sitting or lying down, if possible. SEEK MEDICAL CARE IF:  You received a tetanus shot and you have swelling, severe pain, redness, or bleeding at the injection site.  You have a fever.  A wound that was closed breaks open.  You notice a bad smell coming from your wound or your dressing.  You notice something coming out of the wound, such as wood or glass.  Your pain is not controlled with medicine.  You have increased redness, swelling, or pain at the site of your wound.  You have fluid, blood, or pus coming from your wound.  You notice a change in the color of your skin near your wound.  You need to change the dressing frequently due to fluid, blood, or pus draining from the wound.  You develop a new rash.  You develop numbness around the wound. SEEK IMMEDIATE MEDICAL CARE IF:  You develop severe swelling around the wound.  Your pain suddenly increases and is severe.  You develop painful lumps near the wound or on skin that is anywhere on your body.  You have a red streak going away from your wound.  The wound is on your hand or foot and you cannot properly  move a finger or toe.  The wound is on your hand or foot and you notice that your fingers or toes look pale or bluish.   This information is not intended to replace advice given to you by your health care provider. Make sure you discuss any questions you have with your health care provider.   Document Released: 08/09/2005 Document Revised: 12/24/2014 Document Reviewed: 08/05/2014 Elsevier Interactive Patient Education [employer].

## 2016-06-25 ENCOUNTER — Encounter: Payer: Self-pay | Admitting: Physician Assistant

## 2016-06-25 ENCOUNTER — Ambulatory Visit (INDEPENDENT_AMBULATORY_CARE_PROVIDER_SITE_OTHER): Payer: Medicare Other | Admitting: Physician Assistant

## 2016-06-25 VITALS — BP 124/76 | HR 68 | Temp 98.0°F | Resp 16 | Ht 67.0 in | Wt 168.0 lb

## 2016-06-25 DIAGNOSIS — Z4802 Encounter for removal of sutures: Secondary | ICD-10-CM

## 2016-06-25 NOTE — Progress Notes (Signed)
    MRN: [id number] DOB: 1946/08/14  Subjective:   Spencer Lloyd is a 70 y.o. male presenting for follow up for suture removal. He was here 10 days ago for laceration to left elbow after a fall from a seated position onto concrete.  He reports taking the entire course of Keflex which was prescribed by Philis Fendt. He is feeling well today and has no complaints. Denies fever, chills, joint pain or swelling.   [place] has a current medication list which includes the following prescription(s): alphagan p, amlodipine, cephalexin, dorzolamide-timolol, lumigan, omeprazole, and pravastatin. Also has No Known Allergies.  [name]  has no past medical history on file. Also  has no past surgical history on file.  Objective:   Vitals: There were no vitals taken for this visit.  Physical Exam  Constitutional: He is oriented to person, place, and time. He appears well-developed and well-nourished.  Musculoskeletal:       Left elbow: He exhibits normal range of motion, no swelling, no effusion and no deformity.  Laceration is healing well. Skin is entirely intact. No erythema, swelling, drainage present. Sutures are in place.   Neurological: He is alert and oriented to person, place, and time.  Skin: Skin is warm and dry. Capillary refill takes less than 2 seconds.  Psychiatric: He has a normal mood and affect. His behavior is normal. Judgment and thought content normal.    No results found for this or any previous visit (from the past 24 hour(s)). Procedure: Verbal consent obtained. 15 horizontal mattress sutures removed without complication. Wound care discussed.  Assessment and Plan :  1. Visit for suture removal - Sutures were removed without complication. Wound care discussed.     Mercer Pod, PA-C  [facility] 06/25/2016 9:35 AM

## 2016-06-25 NOTE — Patient Instructions (Signed)
     IF you received an x-ray today, you will receive an invoice from [facility]. Please contact [facility] at [phone] with questions or concerns regarding your invoice.   IF you received labwork today, you will receive an invoice from [facility]. Please contact [facility] at [phone] with questions or concerns regarding your invoice.   Our billing staff will not be able to assist you with questions regarding bills from these companies.  You will be contacted with the lab results as soon as they are available. The fastest way to get your results is to activate your My Chart account. Instructions are located on the last page of this paperwork. If you have not heard from us regarding the results in 2 weeks, please contact this office.      

## 2016-09-14 ENCOUNTER — Ambulatory Visit: Payer: Medicare Other | Admitting: Podiatry

## 2016-12-28 ENCOUNTER — Ambulatory Visit (INDEPENDENT_AMBULATORY_CARE_PROVIDER_SITE_OTHER)
Admission: RE | Admit: 2016-12-28 | Discharge: 2016-12-28 | Disposition: A | Discharge status: Home / Self Care | Payer: Medicare Other | Source: Ambulatory Visit | Attending: Emergency Medicine | Admitting: Emergency Medicine

## 2016-12-28 ENCOUNTER — Ambulatory Visit (INDEPENDENT_AMBULATORY_CARE_PROVIDER_SITE_OTHER): Payer: Medicare Other | Admitting: Emergency Medicine

## 2016-12-28 ENCOUNTER — Encounter: Payer: Self-pay | Admitting: Emergency Medicine

## 2016-12-28 VITALS — BP 152/80 | HR 62 | Ht 67.0 in | Wt 168.0 lb

## 2016-12-28 DIAGNOSIS — R05 Cough: Secondary | ICD-10-CM

## 2016-12-28 DIAGNOSIS — R053 Chronic cough: Secondary | ICD-10-CM

## 2016-12-28 NOTE — Patient Instructions (Addendum)
Please continue your omeprazole 20mg  daily We will perform full pulmonary function testing  We will perform a CXR today Depending on the pattern of your cough we may decide to try treating you for allergies or increase your reflux medication in the future.  Follow with Dr Lamonte Sakai next available with PFT.

## 2016-12-28 NOTE — Progress Notes (Signed)
Subjective:    Patient ID: Spencer Lloyd, male    DOB: 12/29/1945, 71 y.o.   MRN: [id number]  HPI 71 year old gentleman, never smoker, with a history of hypertension, GERD, prediabetes followed by Dr. Marisue Humble. He has been dealing with a long-standing cough that goes back to at least 2013. As part of the evaluation he was taken off benazepril. He also was the proton pump inhibitor. His cough probably decreased a little bit after the ACE-I was stopped. He has runs of cough that can last for months, then he gets a break from it for a while. Always happens after he has a URI. He notes that GERD can sometimes flare it up. Usually the cough is dry, but he does sometimes bring up some mucous. He notes some mild allergic sensitivity to grasses, some nasal congestion. He hears some wheezing when he is in a "coughing cycle". No recent CXR. No CP. He was treated with symbicort and advair at one point in the past - unsure whether it helped him. He is on Omeprazole 20mg  daily. Rarely has breakthrough GERD, exacerbates his cough.    Review of Systems  Constitutional: Negative for fever and unexpected weight change.  HENT: Negative for congestion, dental problem, ear pain, nosebleeds, postnasal drip, rhinorrhea, sinus pressure, sneezing, sore throat and trouble swallowing.   Eyes: Negative for redness and itching.  Respiratory: Positive for cough and shortness of breath. Negative for chest tightness and wheezing.   Cardiovascular: Negative for palpitations and leg swelling.  Gastrointestinal: Negative for nausea and vomiting.  Genitourinary: Negative for dysuria.  Musculoskeletal: Negative for joint swelling.  Skin: Negative for rash.  Neurological: Negative for headaches.  Hematological: Does not bruise/bleed easily.  Psychiatric/Behavioral: Negative for dysphoric mood. The patient is not nervous/anxious.    Past Medical History:  Diagnosis Date  . Adenomatous polyp of colon   . Diverticulosis   .  GERD (gastroesophageal reflux disease)   . Glaucoma   . History of melanoma   . Hypercholesteremia   . Hypertension   . Prediabetes      No family history on file.   Social History   Social History  . Marital status: Married    Spouse name: N/A  . Number of children: N/A  . Years of education: N/A   Occupational History  . Not on file.   Social History Main Topics  . Smoking status: Never Smoker  . Smokeless tobacco: Never Used  . Alcohol use 0.0 oz/week     Comment: occas.  . Drug use: No  . Sexual activity: Not on file   Other Topics Concern  . Not on file   Social History Narrative  . No narrative on file     No Known Allergies   Outpatient Medications Prior to Visit  Medication Sig Dispense Refill  . ALPHAGAN P 0.1 % SOLN     . amLODipine (NORVASC) 5 MG tablet     . dorzolamide-timolol (COSOPT) 22.3-6.8 MG/ML ophthalmic solution     . LUMIGAN 0.01 % SOLN     . omeprazole (PRILOSEC) 20 MG capsule Take 20 mg by mouth daily.    . pravastatin (PRAVACHOL) 40 MG tablet      No facility-administered medications prior to visit.         Objective:   Physical Exam Vitals:   12/28/16 1410  BP: (!) 152/80  Pulse: 62  SpO2: 98%  Weight: 168 lb (76.2 kg)  Height: 5\' 7"  (1.702 m)  Gen:  Pleasant, well-nourished, in no distress,  normal affect  ENT: No lesions,  mouth clear,  oropharynx clear, no postnasal drip  Neck: No JVD, no stridor  Lungs: No use of accessory muscles, no wheeze, clear without rales or rhonchi  Cardiovascular: RRR, heart sounds normal, no murmur or gallops, no peripheral edema  Musculoskeletal: No deformities, no cyanosis or clubbing  Neuro: alert, non focal  Skin: Warm, no lesions or rashes      Assessment & Plan:  Chronic cough This been long-standing and comes in cycles. He doesn't endorse significant allergic rhinitis but the timing certainly is suggestive that this is a component. Other clear components include GERD and  upper respiratory infections. He always has a prolonged period of coughing after a viral infection. Likely being sustained by otherwise subclinical GERD. I believe he needs to be evaluated and ruled out for obstructive lung disease, other pulmonary abnormality's. We will check PFT and chest x-ray.  Please continue your omeprazole 20mg  daily We will perform full pulmonary function testing  We will perform a CXR today Depending on the pattern of your cough we may decide to try treating you for allergies or increase your reflux medication in the future.  Follow with Dr Lamonte Sakai next available with PFT.    Baltazar Apo, MD, PhD 12/28/2016, 2:58 PM [facility] [phone] or if no answer [phone]

## 2016-12-28 NOTE — Assessment & Plan Note (Signed)
This been long-standing and comes in cycles. He doesn't endorse significant allergic rhinitis but the timing certainly is suggestive that this is a component. Other clear components include GERD and upper respiratory infections. He always has a prolonged period of coughing after a viral infection. Likely being sustained by otherwise subclinical GERD. I believe he needs to be evaluated and ruled out for obstructive lung disease, other pulmonary abnormality's. We will check PFT and chest x-ray.  Please continue your omeprazole 20mg  daily We will perform full pulmonary function testing  We will perform a CXR today Depending on the pattern of your cough we may decide to try treating you for allergies or increase your reflux medication in the future.  Follow with Dr Lamonte Sakai next available with PFT.

## 2017-03-01 ENCOUNTER — Encounter: Payer: Self-pay | Admitting: Emergency Medicine

## 2017-03-01 ENCOUNTER — Ambulatory Visit (INDEPENDENT_AMBULATORY_CARE_PROVIDER_SITE_OTHER): Payer: Medicare Other | Admitting: Emergency Medicine

## 2017-03-01 DIAGNOSIS — J4520 Mild intermittent asthma, uncomplicated: Secondary | ICD-10-CM | POA: Insufficient documentation

## 2017-03-01 DIAGNOSIS — J4530 Mild persistent asthma, uncomplicated: Secondary | ICD-10-CM | POA: Insufficient documentation

## 2017-03-01 DIAGNOSIS — R05 Cough: Secondary | ICD-10-CM | POA: Diagnosis not present

## 2017-03-01 DIAGNOSIS — J4541 Moderate persistent asthma with (acute) exacerbation: Secondary | ICD-10-CM | POA: Insufficient documentation

## 2017-03-01 DIAGNOSIS — R053 Chronic cough: Secondary | ICD-10-CM

## 2017-03-01 LAB — PULMONARY FUNCTION TEST
DL/VA % PRED: 100 %
DL/VA: 4.42 ml/min/mmHg/L
DLCO COR % PRED: 80 %
DLCO COR: 22.77 ml/min/mmHg
DLCO UNC % PRED: 81 %
DLCO unc: 22.96 ml/min/mmHg
FEF 25-75 POST: 2.29 L/s
FEF 25-75 PRE: 1.49 L/s
FEF2575-%CHANGE-POST: 53 %
FEF2575-%PRED-PRE: 68 %
FEF2575-%Pred-Post: 104 %
FEV1-%Change-Post: 12 %
FEV1-%PRED-PRE: 83 %
FEV1-%Pred-Post: 94 %
FEV1-POST: 2.71 L
FEV1-Pre: 2.4 L
FEV1FVC-%CHANGE-POST: 6 %
FEV1FVC-%PRED-PRE: 92 %
FEV6-%CHANGE-POST: 5 %
FEV6-%Pred-Post: 99 %
FEV6-%Pred-Pre: 93 %
FEV6-Post: 3.67 L
FEV6-Pre: 3.46 L
FEV6FVC-%Change-Post: 0 %
FEV6FVC-%Pred-Post: 105 %
FEV6FVC-%Pred-Pre: 105 %
FVC-%Change-Post: 5 %
FVC-%PRED-POST: 94 %
FVC-%PRED-PRE: 89 %
FVC-POST: 3.7 L
FVC-PRE: 3.5 L
PRE FEV1/FVC RATIO: 69 %
Post FEV1/FVC ratio: 73 %
Post FEV6/FVC ratio: 99 %
Pre FEV6/FVC Ratio: 99 %
RV % pred: 80 %
RV: 1.84 L
TLC % PRED: 85 %
TLC: 5.48 L

## 2017-03-01 NOTE — Progress Notes (Signed)
Subjective:    Patient ID: Spencer Lloyd, male    DOB: [birth year], 71 y.o.   MRN: [id number]  HPI 71 year old gentleman, never smoker, with a history of hypertension, GERD, prediabetes followed by Dr. Marisue Humble. He has been dealing with a long-standing cough that goes back to at least 2013. As part of the evaluation he was taken off benazepril. He also was the proton pump inhibitor. His cough probably decreased a little bit after the ACE-I was stopped. He has runs of cough that can last for months, then he gets a break from it for a while. Always happens after he has a URI. He notes that GERD can sometimes flare it up. Usually the cough is dry, but he does sometimes bring up some mucous. He notes some mild allergic sensitivity to grasses, some nasal congestion. He hears some wheezing when he is in a "coughing cycle". No recent CXR. No CP. He was treated with symbicort and advair at one point in the past - unsure whether it helped him. He is on Omeprazole 20mg  daily. Rarely has breakthrough GERD, exacerbates his cough.   ROV 03/01/17 -- This is a follow-up visit for patient, never smoker, with history of chronic cough. He also has GERD and there may be a connection between the two. He underwent pulmonary function testing today that I have personally reviewed. This shows evidence for mild asthma based on a decreased ratio and a positive bronchodilator response area his lung volumes and diffusion capacity are both normal. His cough right now is less than last time. Still happens every day. He remains on omeprazole qd, good control of his GERD.    Review of Systems  Constitutional: Negative for fever and unexpected weight change.  HENT: Negative for congestion, dental problem, ear pain, nosebleeds, postnasal drip, rhinorrhea, sinus pressure, sneezing, sore throat and trouble swallowing.   Eyes: Negative for redness and itching.  Respiratory: Positive for cough. Negative for chest tightness, shortness of  breath and wheezing.   Cardiovascular: Negative for palpitations and leg swelling.  Gastrointestinal: Negative for nausea and vomiting.  Genitourinary: Negative for dysuria.  Musculoskeletal: Negative for joint swelling.  Skin: Negative for rash.  Neurological: Negative for headaches.  Hematological: Does not bruise/bleed easily.  Psychiatric/Behavioral: Negative for dysphoric mood. The patient is not nervous/anxious.     Past Medical History:  Diagnosis Date  . Adenomatous polyp of colon   . Diverticulosis   . GERD (gastroesophageal reflux disease)   . Glaucoma   . History of melanoma   . Hypercholesteremia   . Hypertension   . Prediabetes      No family history on file.   Social History   Social History  . Marital status: Married    Spouse name: N/A  . Number of children: N/A  . Years of education: N/A   Occupational History  . Not on file.   Social History Main Topics  . Smoking status: Never Smoker  . Smokeless tobacco: Never Used  . Alcohol use 0.0 oz/week     Comment: occas.  . Drug use: No  . Sexual activity: Not on file   Other Topics Concern  . Not on file   Social History Narrative  . No narrative on file     No Known Allergies   Outpatient Medications Prior to Visit  Medication Sig Dispense Refill  . ALPHAGAN P 0.1 % SOLN Place 1 drop into both eyes 2 (two) times daily.     Marland Kitchen amLODipine (  NORVASC) 5 MG tablet Take 5 mg by mouth daily.     . dorzolamide-timolol (COSOPT) 22.3-6.8 MG/ML ophthalmic solution Place 1 drop into both eyes 2 (two) times daily.     Marland Kitchen LUMIGAN 0.01 % SOLN Place 1 drop into both eyes at bedtime.     Marland Kitchen omeprazole (PRILOSEC) 20 MG capsule Take 20 mg by mouth daily.    . pravastatin (PRAVACHOL) 40 MG tablet Take 40 mg by mouth daily.      No facility-administered medications prior to visit.         Objective:   Physical Exam Vitals:   03/01/17 1553 03/01/17 1556  BP:  140/80  Pulse:  (!) 58  SpO2:  98%  Weight: 169  lb (76.7 kg)   Height: 5\' 7"  (1.702 m)   Gen: Pleasant, well-nourished, in no distress,  normal affect  ENT: No lesions,  mouth clear,  oropharynx clear, no postnasal drip  Neck: No JVD, no stridor  Lungs: No use of accessory muscles, no wheeze, clear without rales or rhonchi  Cardiovascular: RRR, heart sounds normal, no murmur or gallops, no peripheral edema  Musculoskeletal: No deformities, no cyanosis or clubbing  Neuro: alert, non focal  Skin: Warm, no lesions or rashes      Assessment & Plan:  Mild intermittent asthma With mild obstruction and a positive bronchodilator response. Certainly some of his chronic cough and her current symptoms could be related to lower airways disease. We talked about times to use albuterol as needed. He has been tried empirically on Symbicort in the past, did not want to continue this due to his glaucoma. I believe that we can get away with short-acting bronchodilator as needed for now.  Chronic cough Suspect there are contributions of both upper airway disease (primarily irritated by GERD, possibly also occasionally by rhinitis) and also mild intermittent asthma. Continue GERD therapy. Discussed possibly starting a nasal steroid in the future if rhinitis becomes a problem. Reviewed albuterol use with him.  Baltazar Apo, MD, PhD 03/01/2017, 4:26 PM [facility] [phone] or if no answer [phone]

## 2017-03-01 NOTE — Assessment & Plan Note (Signed)
Suspect there are contributions of both upper airway disease (primarily irritated by GERD, possibly also occasionally by rhinitis) and also mild intermittent asthma. Continue GERD therapy. Discussed possibly starting a nasal steroid in the future if rhinitis becomes a problem. Reviewed albuterol use with him.

## 2017-03-01 NOTE — Assessment & Plan Note (Addendum)
With mild obstruction and a positive bronchodilator response. Certainly some of his chronic cough and her current symptoms could be related to lower airways disease. We talked about times to use albuterol as needed. He has been tried empirically on Symbicort in the past, did not want to continue this due to his glaucoma. I believe that we can get away with short-acting bronchodilator as needed for now.

## 2017-03-01 NOTE — Patient Instructions (Addendum)
Please continue your omeprazole 20mg  daily Try using albuterol 2 puffs as needed for spells of coughing, shortness of breath.  If you develop nasal congestion or drainage then try using fluticasone nasal spray, 2 sprays each nostril daily.  Follow with Dr Lamonte Sakai in 12 months or sooner if you have any new symptoms or problems.

## 2017-03-01 NOTE — Progress Notes (Signed)
PFT done today. 

## 2017-03-22 ENCOUNTER — Telehealth: Payer: Self-pay | Admitting: Emergency Medicine

## 2017-03-22 MED ORDER — ALBUTEROL SULFATE HFA 108 (90 BASE) MCG/ACT IN AERS: 2.0000 | INHALATION_SPRAY | RESPIRATORY_TRACT | 5 refills | Status: DC | PRN

## 2017-03-22 NOTE — Telephone Encounter (Signed)
Spoke with pt. He is needing a prescription for Albuterol HFA. Rx has been sent in. Nothing further was needed.

## 2018-05-26 ENCOUNTER — Other Ambulatory Visit: Payer: Self-pay | Admitting: Urology

## 2018-06-28 NOTE — Patient Instructions (Signed)
Spencer Lloyd  1946/01/20    Your procedure is scheduled on:  07-06-2018    Report to [facility] Main  Entrance,  Report to admitting at  7:00 AM    Call this number if you have problems the morning of surgery [phone]      Remember: Do not eat food or drink liquids :After Midnight.                                       BRUSH YOUR TEETH MORNING OF SURGERY AND RINSE YOUR MOUTH OUT, NO CHEWING GUM CANDY OR MINTS.      Take these medicines the morning of surgery with A SIP OF WATER:  Albuterol inhaler if needed and bring with you day of surgery, Eye drops as usual                                 You may not have any metal on your body including hair pins and piercings              Do not wear jewelry, make-up, lotions, powders or perfumes, deodorant                          Men may shave face and neck.     Do not bring valuables to the hospital. [place].  Contacts, dentures or bridgework may not be worn into surgery.  Leave suitcase in the car. After surgery it may be brought to your room.       Special Instructions: N/A          _____________________________________________________________________             [facility] - Preparing for Surgery Before surgery, you can play an important role.  Because skin is not sterile, your skin needs to be as free of germs as possible.  You can reduce the number of germs on your skin by washing with CHG (chlorahexidine gluconate) soap before surgery.  CHG is an antiseptic cleaner which kills germs and bonds with the skin to continue killing germs even after washing. Please DO NOT use if you have an allergy to CHG or antibacterial soaps.  If your skin becomes reddened/irritated stop using the CHG and inform your nurse when you arrive at Short Stay. Do not shave (including legs and underarms) for at least 48 hours prior to the first CHG shower.  You  may shave your face/neck. Please follow these instructions carefully:  1.  Shower with CHG Soap the night before surgery and the  morning of Surgery.  2.  If you choose to wash your hair, wash your hair first as usual with your  normal  shampoo.  3.  After you shampoo, rinse your hair and body thoroughly to remove the  shampoo.                            4.  Use CHG as you would any other liquid soap.  You can apply chg directly  to the skin and wash  Gently with a scrungie or clean washcloth.  5.  Apply the CHG Soap to your body ONLY FROM THE NECK DOWN.   Do not use on face/ open                           Wound or open sores. Avoid contact with eyes, ears mouth and genitals (private parts).                       Wash face,  Genitals (private parts) with your normal soap.             6.  Wash thoroughly, paying special attention to the area where your surgery  will be performed.  7.  Thoroughly rinse your body with warm water from the neck down.  8.  DO NOT shower/wash with your normal soap after using and rinsing off  the CHG Soap.             9.  Pat yourself dry with a clean towel.            10.  Wear clean pajamas.            11.  Place clean sheets on your bed the night of your first shower and do not  sleep with pets. Day of Surgery : Do not apply any lotions/deodorants the morning of surgery.  Please wear clean clothes to the hospital/surgery center.  FAILURE TO FOLLOW THESE INSTRUCTIONS MAY RESULT IN THE CANCELLATION OF YOUR SURGERY PATIENT SIGNATURE_________________________________  NURSE SIGNATURE__________________________________  ________________________________________________________________________

## 2018-06-30 ENCOUNTER — Other Ambulatory Visit: Payer: Self-pay

## 2018-06-30 ENCOUNTER — Encounter (HOSPITAL_COMMUNITY): Payer: Self-pay

## 2018-06-30 ENCOUNTER — Encounter (HOSPITAL_COMMUNITY)
Admission: RE | Admit: 2018-06-30 | Discharge: 2018-06-30 | Disposition: A | Discharge status: Home / Self Care | Payer: Medicare Other | Source: Ambulatory Visit | Attending: Urology | Admitting: Urology

## 2018-06-30 DIAGNOSIS — Z01818 Encounter for other preprocedural examination: Secondary | ICD-10-CM | POA: Insufficient documentation

## 2018-06-30 HISTORY — DX: Personal history of colonic polyps: Z86.010

## 2018-06-30 HISTORY — DX: Mild intermittent asthma, uncomplicated: J45.20

## 2018-06-30 HISTORY — DX: Benign prostatic hyperplasia with lower urinary tract symptoms: N40.1

## 2018-06-30 HISTORY — DX: Unspecified glaucoma: H40.9

## 2018-06-30 HISTORY — DX: Personal history of other specified conditions: Z87.898

## 2018-06-30 HISTORY — DX: Presence of spectacles and contact lenses: Z97.3

## 2018-06-30 HISTORY — DX: Personal history of malignant melanoma of skin: Z85.820

## 2018-06-30 HISTORY — DX: Personal history of adenomatous and serrated colon polyps: Z86.0101

## 2018-06-30 LAB — CBC
HEMATOCRIT: 50.5 % (ref 39.0–52.0)
Hemoglobin: 16.6 g/dL (ref 13.0–17.0)
MCH: 30.1 pg (ref 26.0–34.0)
MCHC: 32.9 g/dL (ref 30.0–36.0)
MCV: 91.7 fL (ref 80.0–100.0)
Platelets: 202 10*3/uL (ref 150–400)
RBC: 5.51 MIL/uL (ref 4.22–5.81)
RDW: 13.2 % (ref 11.5–15.5)
WBC: 7 10*3/uL (ref 4.0–10.5)
nRBC: 0 % (ref 0.0–0.2)

## 2018-06-30 LAB — BASIC METABOLIC PANEL
ANION GAP: 8 (ref 5–15)
BUN: 17 mg/dL (ref 8–23)
CALCIUM: 9.5 mg/dL (ref 8.9–10.3)
CO2: 23 mmol/L (ref 22–32)
Chloride: 111 mmol/L (ref 98–111)
Creatinine, Ser: 1.23 mg/dL (ref 0.61–1.24)
GFR calc Af Amer: >60 mL/min (ref >60)
GFR calc non Af Amer: 57 mL/min — ABNORMAL LOW (ref >60)
Glucose, Bld: 109 mg/dL — ABNORMAL HIGH (ref 70–99)
Potassium: 4.6 mmol/L (ref 3.5–5.1)
Sodium: 142 mmol/L (ref 135–145)

## 2018-06-30 LAB — HEMOGLOBIN A1C
Hgb A1c MFr Bld: 5.5 % (ref 4.8–5.6)
Mean Plasma Glucose: 111.15 mg/dL

## 2018-06-30 NOTE — Progress Notes (Signed)
Final EKG dated 06-30-2018 in epic.

## 2018-07-05 NOTE — H&P (Signed)
CC/HPI: Pt presents today for pre-operative history and physical exam in anticipation of TURP on 07/06/18 by Dr. Jeffie Pollock. He is doing well and without complaint.    Pt denies F/C, HA, CP, SOB, N/V, diarrhea/constipation, back pain, flank pain, hematuria, and dysuria.   HX:     CC/HPI: Frequency, Nocturia and Urgency     [name] returns today in f/u for a flowrate and cystoscopy to assess his outlet for possible TURP or Urolift.   GU Hx: [name] is a 72 yo male who is sent in consultation by Dr. Marisue Humble for frequency and nocturia that dates back to 2011 in his notes but for 30 years. He has been tried on finasteride, flomax and oxybutynin with the oxybutynin most recently but has persistent symptoms. He has urgency that can be severe but he has no incontinence. He has frequency at least every 2 hours and nocturia x 5+. He will have some intermittency if he delays voiding. He has a reduced stream. He feels like he empties. He has no hematuria or dysuria. He has had no UTI's, stones or GU surgery other than a vasectomy. He thinks he has had a PSA but that didn't come with his results. He drinks a lot of coffee and sweet tea.    ALLERGIES: None    MEDICATIONS: Acetazolamide 250 mg tablet  Alphagan P 0.1 % drops  Amlodipine Besylate 5 mg tablet  Dorzolamide-Timolol 22.3 mg-6.8 mg/ml drops  Pravastatin Sodium 40 mg tablet  Proair Hfa 90 mcg hfa aerosol with adapter  Rocklatan 0.02 %-0.005 % drops     GU PSH: Complex Uroflow - 03/15/2018, 02/22/2018 Cystoscopy - 03/15/2018 Vasectomy    NON-GU PSH: Skin Biopsy - about 1998    GU PMH: Bladder Stone, He had 2 small stones that could easily be removed at TURP. - 03/15/2018 BPH w/LUTS, He has severe LUTS with OAB and a reduced stream most consistent with BPH with BOO and has failed prior therapy with finasteride, flomax and oxybutynin. I am going to have him return for a repeat flowrate and cystoscopy to assess his outlet. He may be best  served by a Urolift or TURP. - 02/22/2018 Nocturia - 02/22/2018 Urinary Frequency, I have encouraged him to replace his coffee and tea with water to see if that will help his OAB symptoms. 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   Tobacco Use Assessment Completed: Used Tobacco in last 30 days? Does not use smokeless tobacco. Drinks 4 drinks per week. Types of alcohol consumed: Liquor.  Does not use drugs. Drinks 4+ caffeinated drinks per day. Has not had a blood transfusion. Patient's occupation is/was Retired.     Notes: Retired from [employer].  3-4 vodka drinks per weekend   REVIEW OF SYSTEMS:    GU Review Male:   Patient reports frequent urination, get up at night to urinate, and stream starts and stops. Patient denies hard to postpone urination, burning/ pain with urination, leakage of urine, trouble starting your stream, have to strain to urinate , erection problems, and penile pain.  Gastrointestinal (Upper):   Patient denies nausea, vomiting,  and indigestion/ heartburn.  Gastrointestinal (Lower):   Patient denies diarrhea and constipation.  Constitutional:   Patient denies fever, night sweats, weight loss, and fatigue.  Skin:   Patient denies skin rash/ lesion and itching.  Eyes:   Patient denies blurred vision and double vision.  Ears/ Nose/ Throat:   Patient denies sore throat and sinus problems.  Hematologic/Lymphatic:   Patient denies swollen glands and easy bruising.  Cardiovascular:   Patient denies leg swelling and chest pains.  Respiratory:   Patient reports cough  and shortness of breath.   Endocrine:   Patient denies excessive thirst.  Musculoskeletal:   Patient denies joint pain and back pain.  Neurological:   Patient denies headaches and dizziness.  Psychologic:   Patient denies depression and anxiety.   VITAL SIGNS:      06/27/2018 01:32 PM  Weight 168 lb / 76.2 kg  Height 67 in / 170.18 cm  BP 180/89 mmHg  Pulse 76 /min  Temperature 98.0 F / 36.6 C  BMI 26.3 kg/m   MULTI-SYSTEM PHYSICAL EXAMINATION:    Constitutional: Well-nourished. No physical deformities. Normally developed. Good grooming.  Neck: Neck symmetrical, not swollen. Normal tracheal position.  Respiratory: Normal breath sounds. No labored breathing, no use of accessory muscles.   Cardiovascular: Regular rate and rhythm. No murmur, no gallop.   Lymphatic: No enlargement of neck, axillae, groin.  Skin: No paleness, no jaundice, no cyanosis. No lesion, no ulcer, no rash.  Neurologic / Psychiatric: Oriented to time, oriented to place, oriented to person. No depression, no anxiety, no agitation.  Gastrointestinal: No mass, no tenderness, no rigidity, non obese abdomen.  Eyes: Normal conjunctivae. Normal eyelids.  Ears, Nose, Mouth, and Throat: Left ear no scars, no lesions, no masses. Right ear no scars, no lesions, no masses. Nose no scars, no lesions, no masses. Normal hearing. Normal lips.  Musculoskeletal: Normal gait and station of head and neck.     PAST DATA REVIEWED:  Source Of History:  Patient  Records Review:   Previous Patient Records  Urine Test Review:   Urinalysis   06/27/18  Urinalysis  Urine Appearance Clear   Urine Color Yellow   Urine Glucose Neg mg/dL  Urine Bilirubin Neg mg/dL  Urine Ketones Neg mg/dL  Urine Specific Gravity 1.025   Urine Blood Neg ery/uL  Urine pH 5.5   Urine Protein Neg mg/dL  Urine Urobilinogen 0.2 mg/dL  Urine Nitrites Neg   Urine Leukocyte Esterase Neg leu/uL   PROCEDURES:          Urinalysis - 81003 Dipstick Dipstick  Cont'd  Color: Yellow Bilirubin: Neg mg/dL  Appearance: Clear Ketones: Neg mg/dL  Specific Gravity: 1.025 Blood: Neg ery/uL  pH: 5.5 Protein: Neg mg/dL  Glucose: Neg mg/dL Urobilinogen: 0.2 mg/dL    Nitrites: Neg    Leukocyte Esterase: Neg leu/uL    ASSESSMENT:      ICD-10 Details  1 GU:   BPH w/LUTS - N40.1    PLAN:           Schedule Return Visit/Planned Activity: Keep Scheduled Appointment - Schedule Surgery          Document Letter(s):  Created for Patient: Clinical Summary         Notes:   There are no changes in the patients history or physical exam since last evaluation by Dr. Jeffie Pollock. Pt is scheduled to undergo TURP 07/06/18.   All pt's questions were answered to the best of my ability.  Next Appointment:      Next Appointment: 07/06/2018 09:00 AM    Appointment Type: Surgery     Location: [facility] [phone]    Provider: Irine Seal, M.D.    Reason for Visit: OBS WL TURP

## 2018-07-06 ENCOUNTER — Ambulatory Visit (HOSPITAL_COMMUNITY): Payer: Medicare Other | Admitting: Anesthesiology

## 2018-07-06 ENCOUNTER — Encounter (HOSPITAL_COMMUNITY): Payer: Self-pay

## 2018-07-06 ENCOUNTER — Encounter (HOSPITAL_COMMUNITY)
Admission: RE | Disposition: A | Discharge status: Home / Self Care | Payer: Self-pay | Source: Ambulatory Visit | Attending: Urology

## 2018-07-06 ENCOUNTER — Observation Stay (HOSPITAL_COMMUNITY)
Admission: RE | Admit: 2018-07-06 | Discharge: 2018-07-07 | Disposition: A | Discharge status: Home / Self Care | Payer: Medicare Other | Source: Ambulatory Visit | Attending: Urology | Admitting: Urology

## 2018-07-06 ENCOUNTER — Other Ambulatory Visit: Payer: Self-pay

## 2018-07-06 DIAGNOSIS — I10 Essential (primary) hypertension: Secondary | ICD-10-CM | POA: Insufficient documentation

## 2018-07-06 DIAGNOSIS — Z85820 Personal history of malignant melanoma of skin: Secondary | ICD-10-CM | POA: Diagnosis not present

## 2018-07-06 DIAGNOSIS — N138 Other obstructive and reflux uropathy: Secondary | ICD-10-CM | POA: Insufficient documentation

## 2018-07-06 DIAGNOSIS — R3915 Urgency of urination: Secondary | ICD-10-CM | POA: Insufficient documentation

## 2018-07-06 DIAGNOSIS — R351 Nocturia: Secondary | ICD-10-CM | POA: Diagnosis not present

## 2018-07-06 DIAGNOSIS — R350 Frequency of micturition: Secondary | ICD-10-CM | POA: Diagnosis not present

## 2018-07-06 DIAGNOSIS — N401 Enlarged prostate with lower urinary tract symptoms: Secondary | ICD-10-CM | POA: Diagnosis not present

## 2018-07-06 DIAGNOSIS — N4231 Prostatic intraepithelial neoplasia: Secondary | ICD-10-CM | POA: Diagnosis not present

## 2018-07-06 DIAGNOSIS — N210 Calculus in bladder: Secondary | ICD-10-CM | POA: Diagnosis not present

## 2018-07-06 DIAGNOSIS — E7800 Pure hypercholesterolemia, unspecified: Secondary | ICD-10-CM | POA: Diagnosis not present

## 2018-07-06 DIAGNOSIS — Z79899 Other long term (current) drug therapy: Secondary | ICD-10-CM | POA: Insufficient documentation

## 2018-07-06 HISTORY — PX: TRANSURETHRAL RESECTION OF PROSTATE: SHX73

## 2018-07-06 SURGERY — TURP (TRANSURETHRAL RESECTION OF PROSTATE)
Anesthesia: General | Site: Prostate

## 2018-07-06 MED ORDER — CEFAZOLIN SODIUM-DEXTROSE 2-4 GM/100ML-% IV SOLN
2.0000 g | INTRAVENOUS | Status: AC
  Administered 2018-07-06: 2 g via INTRAVENOUS
  Filled 2018-07-06: qty 100

## 2018-07-06 MED ORDER — PROPOFOL 10 MG/ML IV BOLUS
INTRAVENOUS | Status: AC
  Filled 2018-07-06: qty 20

## 2018-07-06 MED ORDER — OXYCODONE HCL 5 MG PO TABS: 5.0000 mg | ORAL_TABLET | ORAL | Status: DC | PRN

## 2018-07-06 MED ORDER — DORZOLAMIDE HCL-TIMOLOL MAL 2-0.5 % OP SOLN
1.0000 [drp] | Freq: Two times a day (BID) | OPHTHALMIC | Status: DC
  Administered 2018-07-06 – 2018-07-07 (×2): 1 [drp] via OPHTHALMIC
  Filled 2018-07-06: qty 10

## 2018-07-06 MED ORDER — FENTANYL CITRATE (PF) 100 MCG/2ML IJ SOLN
INTRAMUSCULAR | Status: AC
  Filled 2018-07-06: qty 2

## 2018-07-06 MED ORDER — FENTANYL CITRATE (PF) 100 MCG/2ML IJ SOLN
INTRAMUSCULAR | Status: DC | PRN
  Administered 2018-07-06: 50 ug via INTRAVENOUS

## 2018-07-06 MED ORDER — ONDANSETRON HCL 4 MG/2ML IJ SOLN
INTRAMUSCULAR | Status: AC
  Filled 2018-07-06: qty 2

## 2018-07-06 MED ORDER — ONDANSETRON HCL 4 MG/2ML IJ SOLN
INTRAMUSCULAR | Status: DC | PRN
  Administered 2018-07-06: 4 mg via INTRAVENOUS

## 2018-07-06 MED ORDER — PRAVASTATIN SODIUM 40 MG PO TABS
40.0000 mg | ORAL_TABLET | Freq: Every day | ORAL | Status: DC
  Administered 2018-07-06: 40 mg via ORAL
  Filled 2018-07-06: qty 1

## 2018-07-06 MED ORDER — DOCUSATE SODIUM 100 MG PO CAPS: 100.0000 mg | ORAL_CAPSULE | Freq: Two times a day (BID) | ORAL | 2 refills | Status: DC

## 2018-07-06 MED ORDER — ACETAMINOPHEN 500 MG PO TABS: 500.0000 mg | ORAL_TABLET | Freq: Every day | ORAL | Status: DC | PRN

## 2018-07-06 MED ORDER — EPHEDRINE 5 MG/ML INJ
INTRAVENOUS | Status: AC
  Filled 2018-07-06: qty 10

## 2018-07-06 MED ORDER — FENTANYL CITRATE (PF) 100 MCG/2ML IJ SOLN: 25.0000 ug | INTRAMUSCULAR | Status: DC | PRN

## 2018-07-06 MED ORDER — LIDOCAINE 2% (20 MG/ML) 5 ML SYRINGE
INTRAMUSCULAR | Status: DC | PRN
  Administered 2018-07-06: 50 mg via INTRAVENOUS

## 2018-07-06 MED ORDER — BISACODYL 10 MG RE SUPP: 10.0000 mg | Freq: Every day | RECTAL | Status: DC | PRN

## 2018-07-06 MED ORDER — ONDANSETRON HCL 4 MG/2ML IJ SOLN: 4.0000 mg | INTRAMUSCULAR | Status: DC | PRN

## 2018-07-06 MED ORDER — ACETAZOLAMIDE 250 MG PO TABS
250.0000 mg | ORAL_TABLET | Freq: Two times a day (BID) | ORAL | Status: DC
  Administered 2018-07-07: 250 mg via ORAL
  Filled 2018-07-06 (×3): qty 1

## 2018-07-06 MED ORDER — SODIUM CHLORIDE 0.9 % IR SOLN
Status: DC | PRN
  Administered 2018-07-06: 6000 mL

## 2018-07-06 MED ORDER — DOCUSATE SODIUM 100 MG PO CAPS
100.0000 mg | ORAL_CAPSULE | Freq: Two times a day (BID) | ORAL | Status: DC
  Administered 2018-07-06 – 2018-07-07 (×2): 100 mg via ORAL
  Filled 2018-07-06 (×2): qty 1

## 2018-07-06 MED ORDER — LIDOCAINE 2% (20 MG/ML) 5 ML SYRINGE
INTRAMUSCULAR | Status: AC
  Filled 2018-07-06: qty 5

## 2018-07-06 MED ORDER — BRIMONIDINE TARTRATE 0.15 % OP SOLN
1.0000 [drp] | Freq: Two times a day (BID) | OPHTHALMIC | Status: DC
  Administered 2018-07-06 – 2018-07-07 (×2): 1 [drp] via OPHTHALMIC
  Filled 2018-07-06: qty 5

## 2018-07-06 MED ORDER — NETARSUDIL-LATANOPROST 0.02-0.005 % OP SOLN: 1.0000 [drp] | Freq: Every day | OPHTHALMIC | Status: DC

## 2018-07-06 MED ORDER — SENNOSIDES-DOCUSATE SODIUM 8.6-50 MG PO TABS: 1.0000 | ORAL_TABLET | Freq: Every evening | ORAL | Status: DC | PRN

## 2018-07-06 MED ORDER — AMLODIPINE BESYLATE 5 MG PO TABS
5.0000 mg | ORAL_TABLET | Freq: Every day | ORAL | Status: DC
  Administered 2018-07-06: 5 mg via ORAL
  Filled 2018-07-06: qty 1

## 2018-07-06 MED ORDER — POTASSIUM CHLORIDE IN NACL 20-0.45 MEQ/L-% IV SOLN
INTRAVENOUS | Status: DC
  Administered 2018-07-06 (×2): via INTRAVENOUS
  Filled 2018-07-06 (×5): qty 1000

## 2018-07-06 MED ORDER — SODIUM CHLORIDE 0.9 % IR SOLN
Status: DC | PRN
  Administered 2018-07-06: 1000 mL via INTRAVESICAL

## 2018-07-06 MED ORDER — PROPOFOL 10 MG/ML IV BOLUS
INTRAVENOUS | Status: DC | PRN
  Administered 2018-07-06: 150 mg via INTRAVENOUS

## 2018-07-06 MED ORDER — TRAMADOL HCL 50 MG PO TABS: 50.0000 mg | ORAL_TABLET | Freq: Four times a day (QID) | ORAL | 0 refills | Status: DC | PRN

## 2018-07-06 MED ORDER — EPHEDRINE SULFATE-NACL 50-0.9 MG/10ML-% IV SOSY
PREFILLED_SYRINGE | INTRAVENOUS | Status: DC | PRN
  Administered 2018-07-06: 5 mg via INTRAVENOUS
  Administered 2018-07-06: 10 mg via INTRAVENOUS

## 2018-07-06 MED ORDER — PHENYLEPHRINE 40 MCG/ML (10ML) SYRINGE FOR IV PUSH (FOR BLOOD PRESSURE SUPPORT)
PREFILLED_SYRINGE | INTRAVENOUS | Status: DC | PRN
  Administered 2018-07-06 (×2): 80 ug via INTRAVENOUS

## 2018-07-06 MED ORDER — SODIUM CHLORIDE 0.9 % IR SOLN
3000.0000 mL | Status: DC
  Administered 2018-07-06 (×2): 3000 mL

## 2018-07-06 MED ORDER — ALBUTEROL SULFATE (2.5 MG/3ML) 0.083% IN NEBU: 3.0000 mL | INHALATION_SOLUTION | RESPIRATORY_TRACT | Status: DC | PRN

## 2018-07-06 MED ORDER — DEXAMETHASONE SODIUM PHOSPHATE 10 MG/ML IJ SOLN
INTRAMUSCULAR | Status: DC | PRN
  Administered 2018-07-06: 4 mg via INTRAVENOUS

## 2018-07-06 MED ORDER — DEXAMETHASONE SODIUM PHOSPHATE 10 MG/ML IJ SOLN
INTRAMUSCULAR | Status: AC
  Filled 2018-07-06: qty 1

## 2018-07-06 MED ORDER — HYDROMORPHONE HCL 1 MG/ML IJ SOLN: 0.5000 mg | INTRAMUSCULAR | Status: DC | PRN

## 2018-07-06 MED ORDER — ACETAMINOPHEN 325 MG PO TABS: 650.0000 mg | ORAL_TABLET | ORAL | Status: DC | PRN

## 2018-07-06 MED ORDER — FLEET ENEMA 7-19 GM/118ML RE ENEM: 1.0000 | ENEMA | Freq: Once | RECTAL | Status: DC | PRN

## 2018-07-06 MED ORDER — LACTATED RINGERS IV SOLN
INTRAVENOUS | Status: DC
  Administered 2018-07-06: 1000 mL via INTRAVENOUS

## 2018-07-06 SURGICAL SUPPLY — 17 items
BAG URINE DRAINAGE (UROLOGICAL SUPPLIES) ×2 IMPLANT
BAG URO CATCHER STRL LF (MISCELLANEOUS) ×3 IMPLANT
CATH FOLEY 3WAY 30CC 22FR (CATHETERS) ×2 IMPLANT
COVER WAND RF STERILE (DRAPES) IMPLANT
ELECT REM PT RETURN 15FT ADLT (MISCELLANEOUS) ×3 IMPLANT
GLOVE SURG SS PI 8.0 STRL IVOR (GLOVE) ×2 IMPLANT
GOWN STRL REUS W/TWL XL LVL3 (GOWN DISPOSABLE) ×3 IMPLANT
HOLDER FOLEY CATH W/STRAP (MISCELLANEOUS) ×2 IMPLANT
LOOP CUT BIPOLAR 24F LRG (ELECTROSURGICAL) ×2 IMPLANT
MANIFOLD NEPTUNE II (INSTRUMENTS) ×3 IMPLANT
PACK CYSTO (CUSTOM PROCEDURE TRAY) ×3 IMPLANT
SET ASPIRATION TUBING (TUBING) ×3 IMPLANT
SYR 30ML LL (SYRINGE) ×2 IMPLANT
SYRINGE IRR TOOMEY STRL 70CC (SYRINGE) ×2 IMPLANT
TUBING CONNECTING 10 (TUBING) ×2 IMPLANT
TUBING CONNECTING 10' (TUBING) ×1
TUBING UROLOGY SET (TUBING) ×3 IMPLANT

## 2018-07-06 NOTE — Transfer of Care (Signed)
Immediate Anesthesia Transfer of Care Note  Patient: Spencer Lloyd  Procedure(s) Performed: TRANSURETHRAL RESECTION OF THE PROSTATE (TURP) (N/A Prostate)  Patient Location: PACU  Anesthesia Type:General  Level of Consciousness: awake, alert  and oriented  Airway & Oxygen Therapy: Patient Spontanous Breathing and Patient connected to face mask oxygen  Post-op Assessment: Report given to RN and Post -op Vital signs reviewed and stable  Post vital signs: Reviewed and stable  Last Vitals:  Vitals Value Taken Time  BP 151/76 07/06/2018 10:17 AM  Temp    Pulse    Resp 9 07/06/2018 10:19 AM  SpO2    Vitals shown include unvalidated device data.  Last Pain:  Vitals:   07/06/18 0727  TempSrc:   PainSc: 0-No pain      Patients Stated Pain Goal: 5 (90/30/14 9969)  Complications: No apparent anesthesia complications

## 2018-07-06 NOTE — Interval H&P Note (Signed)
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 After consideration of risks, benefits and other options for treatment, the patient has consented to  Procedure(s): TRANSURETHRAL RESECTION OF THE PROSTATE (TURP) (N/A) as a surgical intervention .  The patient's history has been reviewed, patient examined, no change in status, stable for surgery.  I have reviewed the patient's chart and labs.  Questions were answered to the patient's satisfaction.     Irine Seal

## 2018-07-06 NOTE — Discharge Instructions (Signed)
Transurethral Resection of the Prostate, Care After °Refer to this sheet in the next few weeks. These instructions provide you with information about caring for yourself after your procedure. Your health care provider may also give you more specific instructions. Your treatment has been planned according to current medical practices, but problems sometimes occur. Call your health care provider if you have any problems or questions after your procedure. °What can I expect after the procedure? °After the procedure, it is common to have: °· Mild pain in your lower abdomen. °· Soreness or mild discomfort in your penis from having the catheter inserted during the procedure. °· A feeling of urgency when you need to urinate. °· A small amount of blood in your urine. You may notice some small blood clots in your urine. These are normal. ° °Follow these instructions at home: °Medicines ° °· Take over-the-counter and prescription medicines only as told by your health care provider. °· Do not drive or operate heavy machinery while taking prescription pain medicine. °· Do not drive for 24 hours if you received a sedative. °· If you were prescribed antibiotic medicine, take it as told by your health care provider. Do not stop taking the antibiotic even if you start to feel better. °Activity °· Return to your normal activities as told by your health care provider. Ask your health care provider what activities are safe for you. °· Do not lift anything that is heavier than 10 lb (4.5 kg) for 3 weeks after your procedure, or as long as told by your health care provider. °· Avoid intense physical activity for as long as told by your health care provider. °· Walk at least one time every day. This helps to prevent blood clots. You may increase your physical activity gradually as you start to feel better. °Lifestyle °· Do not drink alcohol for as long as told by your health care provider. This is especially important if you are taking  prescription pain medicines. °· Do not engage in sexual activity until your health care provider says that you can do this. °General instructions °· Do not take baths, swim, or use a hot tub until your health care provider approves. °· Drink enough fluid to keep your urine clear or pale yellow. °· Urinate as soon as you feel the need to. Do not try to hold your urine for long periods of time. °· If your health care provider approves, you may take a stool softener for 2-3 weeks to prevent you from straining to have a bowel movement. °· Wear compression stockings as told by your health care provider. These stockings help to prevent blood clots and reduce swelling in your legs. °· Keep all follow-up visits as told by your health care provider. This is important. °Contact a health care provider if: °· You have difficulty urinating. °· You have a fever. °· You have pain that gets worse or does not improve with medicine. °· You have blood in your urine that does not go away after 1 week of resting and drinking more fluids. °· You have swelling in your penis or testicles. °Get help right away if: °· You are unable to urinate. °· You are having more blood clots in your urine instead of fewer. °· You have: °? Large blood clots. °? A lot of blood in your urine. °? Pain in your back or lower abdomen. °? Pain or swelling in your legs. °? Chills and you are shaking. °This information is not intended to   replace advice given to you by your health care provider. Make sure you discuss any questions you have with your health care provider. °Document Released: 08/09/2005 Document Revised: 04/11/2016 Document Reviewed: 05/01/2015 °Elsevier Interactive Patient Education © 2017 [employer] ° °

## 2018-07-06 NOTE — Anesthesia Procedure Notes (Signed)
Procedure Name: LMA Insertion Date/Time: 07/06/2018 9:24 AM Performed by: Niel Hummer, CRNA Pre-anesthesia Checklist: Patient identified, Emergency Drugs available, Suction available and Patient being monitored Patient Re-evaluated:Patient Re-evaluated prior to induction Oxygen Delivery Method: Circle system utilized Preoxygenation: Pre-oxygenation with 100% oxygen Induction Type: IV induction LMA: LMA inserted LMA Size: 5.0 Dental Injury: Teeth and Oropharynx as per pre-operative assessment

## 2018-07-06 NOTE — Progress Notes (Signed)
Pt arrived to floor from Short Stay on stretcher, slid self to floor bed. VSS. CBI draining clear urine, running at moderate rate. Pt oriented to callbell and environment. POC discussed w/ pt and wife at bedside.

## 2018-07-06 NOTE — Anesthesia Preprocedure Evaluation (Addendum)
Anesthesia Evaluation  Patient identified by MRN, date of birth, ID band Patient awake    Reviewed: Allergy & Precautions, NPO status , Patient's Chart, lab work & pertinent test results  Airway Mallampati: II  TM Distance: >3 FB Neck ROM: Full    Dental no notable dental hx. (+) Teeth Intact, Dental Advisory Given   Pulmonary asthma ,    Pulmonary exam normal breath sounds clear to auscultation       Cardiovascular hypertension, Pt. on medications negative cardio ROS Normal cardiovascular exam Rhythm:Regular Rate:Normal     Neuro/Psych negative neurological ROS  negative psych ROS   GI/Hepatic Neg liver ROS, GERD  ,  Endo/Other  negative endocrine ROS  Renal/GU negative Renal ROS  negative genitourinary   Musculoskeletal negative musculoskeletal ROS (+)   Abdominal   Peds  Hematology negative hematology ROS (+)   Anesthesia Other Findings BPH  Reproductive/Obstetrics                            Anesthesia Physical Anesthesia Plan  ASA: II  Anesthesia Plan: General   Post-op Pain Management:    Induction: Intravenous  PONV Risk Score and Plan: 2 and Ondansetron and Dexamethasone  Airway Management Planned: LMA  Additional Equipment:   Intra-op Plan:   Post-operative Plan: Extubation in OR  Informed Consent: I have reviewed the patients History and Physical, chart, labs and discussed the procedure including the risks, benefits and alternatives for the proposed anesthesia with the patient or authorized representative who has indicated his/her understanding and acceptance.   Dental advisory given  Plan Discussed with: CRNA  Anesthesia Plan Comments:         Anesthesia Quick Evaluation

## 2018-07-06 NOTE — Op Note (Signed)
Preoperative diagnosis: 1.  Bladder outlet obstruction secondary to BPH  Postoperative diagnosis:  1. Bladder outlet obstruction secondary to BPH 2.   Bladder stones Procedure:  1. Cystoscopy with removal of small bladder stones.  2. Transurethral Resection of the prostate  Surgeon: Irine Seal. M.D.  Anesthesia: general  Complications: None  EBL: minimal  Specimens: 1. Bladder stones.  2.  TUR chips  Disposition of specimens: Pathology  Indication: Spencer Lloyd is a patient with bladder outlet obstruction secondary to benign prostatic hyperplasia. After reviewing the management options for treatment, he elected to proceed with the above surgical procedure(s). We have discussed the potential benefits and risks of the procedure, side effects of the proposed treatment, the likelihood of the patient achieving the goals of the procedure, and any potential problems that might occur during the procedure or recuperation. Informed consent has been obtained.  Description of procedure:  The patient was taken to the operating room and general anesthesia was induced.  The patient was placed in the dorsal lithotomy position, prepped and draped in the usual sterile fashion, and preoperative antibiotics were administered. A preoperative time-out was performed.   Cystourethroscopy was performed after urethral dilation with Leander Rams sounds to 31fr after he was noted to have a small meatus.  The patient's urethra was examined and was normal.  The prostate was short with trilobar hyperplasia and obstructing middle lobe.   The bladder was then systematically examined in its entirety. There was mild-mod trabeculation with no evidence of any bladder tumors or other mucosal pathology.  He had several small bladder stones 3-88mm in size and these were aspirated from the bladder.   The ureteral orifices were identified and marked so as to be avoided during the procedure.  The prostate adenoma was then  resected utilizing loop cautery resection with the bipolar cutting loop.  The middle lobe was resected followed by the floor of the prostate back to the verumontanum.  The right and left lobes of the prostate were resected but no anterior resection was required. Care was taken not to resect distal to the verumontanum.  At the completion of the procedure the bladder was evacuated free of chips and hemostasis was insured.  Final inspection revealed intact ureteral orifices, a widely patent TUR channel and an intact external sphincter.   Hemostasis was then achieved with the cautery and the bladder was emptied and reinspected with no significant bleeding noted at the end of the procedure.    A 64fr 3 way catheter was then placed into the bladder and placed on continuous bladder irrigation.  The patient appeared to tolerate the procedure well and without complications.  The patient was able to be awakened and transferred to the recovery unit in satisfactory condition.

## 2018-07-06 NOTE — Anesthesia Postprocedure Evaluation (Signed)
Anesthesia Post Note  Patient: Spencer Lloyd  Procedure(s) Performed: TRANSURETHRAL RESECTION OF THE PROSTATE (TURP) (N/A Prostate)     Patient location during evaluation: PACU Anesthesia Type: General Level of consciousness: awake and alert Pain management: pain level controlled Vital Signs Assessment: post-procedure vital signs reviewed and stable Respiratory status: spontaneous breathing, nonlabored ventilation, respiratory function stable and patient connected to nasal cannula oxygen Cardiovascular status: blood pressure returned to baseline and stable Postop Assessment: no apparent nausea or vomiting Anesthetic complications: no    Last Vitals:  Vitals:   07/06/18 1045 07/06/18 1058  BP: (!) 149/77 (!) 148/84  Pulse: 64 66  Resp: 15 15  Temp: 36.8 C   SpO2: 96% 94%    Last Pain:  Vitals:   07/06/18 1058  TempSrc:   PainSc: 0-No pain                 Chanise Habeck L Zahriah Roes

## 2018-07-06 NOTE — Progress Notes (Signed)
Patient ID: Spencer Lloyd, male   DOB: February 04, 1946, 72 y.o.   MRN: [id number]  Doing well without complaints.  Urine light pink on slow CBI.    BP 129/72 (BP Location: Right Arm)   Pulse 71   Temp 98.2 F (36.8 C) (Oral)   Resp 18   Ht 5\' 7"  (1.702 m)   Wt 74.8 kg   SpO2 96%   BMI 25.84 kg/m    Foley out in AM and home when voiding.   Discharge orders and f/u instructions in chart.

## 2018-07-07 ENCOUNTER — Encounter (HOSPITAL_COMMUNITY): Payer: Self-pay | Admitting: Urology

## 2018-07-07 DIAGNOSIS — N401 Enlarged prostate with lower urinary tract symptoms: Secondary | ICD-10-CM | POA: Diagnosis not present

## 2018-07-07 NOTE — Discharge Summary (Signed)
Patient ID: OLDEN KLAUER MRN: [id number] DOB/AGE: 71-Aug-1947 71 y.o.  Admit date: 07/06/2018 Discharge date: 07/07/2018  Primary Care Physician:  Gaynelle Arabian, MD  Discharge Diagnoses:   Present on Admission: . BPH with urinary obstruction   Consults: None   Discharge Medications: Allergies as of 07/07/2018   No Known Allergies     Medication List    TAKE these medications   acetaminophen 500 MG tablet Commonly known as:  TYLENOL Take 500 mg by mouth daily as needed for moderate pain.   acetaZOLAMIDE 250 MG tablet Commonly known as:  DIAMOX Take 250 mg by mouth 2 (two) times daily.   albuterol 108 (90 Base) MCG/ACT inhaler Commonly known as:  PROVENTIL HFA;VENTOLIN HFA Inhale 2 puffs into the lungs every 4 (four) hours as needed for wheezing or shortness of breath.   ALPHAGAN P 0.1 % Soln Generic drug:  brimonidine Place 1 drop into both eyes 2 (two) times daily.   amLODipine 5 MG tablet Commonly known as:  NORVASC Take 5 mg by mouth at bedtime.   docusate sodium 100 MG capsule Commonly known as:  COLACE Take 1 capsule (100 mg total) by mouth 2 (two) times daily.   dorzolamide-timolol 22.3-6.8 MG/ML ophthalmic solution Commonly known as:  COSOPT Place 1 drop into both eyes 2 (two) times daily.   pravastatin 40 MG tablet Commonly known as:  PRAVACHOL Take 40 mg by mouth at bedtime.   ROCKLATAN 0.02-0.005 % Soln Generic drug:  Netarsudil-Latanoprost Place 1 drop into both eyes at bedtime.   traMADol 50 MG tablet Commonly known as:  ULTRAM Take 1 tablet (50 mg total) by mouth every 6 (six) hours as needed.        Significant Diagnostic Studies:  No results found.  Brief H and P: For complete details please refer to admission H and P, but in brief pt admitted for TURP/removal of bladder stones.  Hospital Course: No postoperative problems, d/c on POD 1. Active Problems:   BPH with urinary obstruction   Day of Discharge BP (!) 149/81  (BP Location: Right Arm)   Pulse 65   Temp 98.3 F (36.8 C) (Oral)   Resp 16   Ht 5\' 7"  (1.702 m)   Wt 74.8 kg   SpO2 97%   BMI 25.84 kg/m   No results found for this or any previous visit (from the past 24 hour(s)).  Physical Exam: General: Alert and awake oriented x3 not in any acute distress. HEENT: anicteric sclera, pupils reactive to light and accommodation CVS: S1-S2 clear no murmur rubs or gallops Chest: clear to auscultation bilaterally, no wheezing rales or rhonchi Abdomen: soft nontender, nondistended, normal bowel sounds, no organomegaly Extremities: no cyanosis, clubbing or edema noted bilaterally Neuro: Cranial nerves II-XII intact, no focal neurological deficits  Disposition:  Home  Diet:  Regular  Activity:  Gradually increase    DISCHARGE FOLLOW-UP  Follow-up Information    Karen Kays, NP On 07/26/2018.   Specialty:  Nurse Practitioner Why:  9am Contact information: [address] [place] [address] [phone]           Time spent on Discharge:   10  mins  Signed: Lillette Boxer Nathaneil Feagans 07/07/2018, 7:45 AM

## 2018-07-07 NOTE — Progress Notes (Signed)
Reviewed discharge information with patient and caregiver. Answered all questions. Patient/caregiver able to teach back medications and reasons to contact MD/911. Patient verbalizes importance of PCP/urology follow up appointment.  [name]. Brigitte Pulse, RN

## 2018-07-07 NOTE — Care Management Note (Signed)
Case Management Note  Patient Details  Name: Spencer Lloyd MRN: [id number] Date of Birth: 1946-02-18 Subjective/Objective:    Discahrged                  Action/Plan: Discharged to home with self-care, orders checked for hhc needs. No CM needs present at time of discharge.  Expected Discharge Date:  07/07/18               Expected Discharge Plan:  Home/Self Care  In-House Referral:     Discharge planning Services  CM Consult  Post Acute Care Choice:    Choice offered to:     DME Arranged:    DME Agency:     HH Arranged:    HH Agency:     Status of Service:  Completed, signed off  If discussed at [employer] of Stay Meetings, dates discussed:    Additional Comments:  Leeroy Cha, RN 07/07/2018, 8:16 AM

## 2018-10-09 ENCOUNTER — Encounter: Payer: Self-pay | Admitting: Podiatry

## 2018-10-09 ENCOUNTER — Ambulatory Visit: Payer: Medicare Other | Admitting: Podiatry

## 2018-10-09 ENCOUNTER — Other Ambulatory Visit: Payer: Self-pay | Admitting: Podiatry

## 2018-10-09 ENCOUNTER — Ambulatory Visit (INDEPENDENT_AMBULATORY_CARE_PROVIDER_SITE_OTHER): Payer: Medicare Other

## 2018-10-09 VITALS — BP 131/75

## 2018-10-09 DIAGNOSIS — M722 Plantar fascial fibromatosis: Secondary | ICD-10-CM

## 2018-10-09 DIAGNOSIS — M79671 Pain in right foot: Secondary | ICD-10-CM

## 2018-10-09 MED ORDER — TRIAMCINOLONE ACETONIDE 10 MG/ML IJ SUSP
10.0000 mg | Freq: Once | INTRAMUSCULAR | Status: AC
  Administered 2018-10-09: 10 mg

## 2018-10-09 MED ORDER — MELOXICAM 15 MG PO TABS: 15.0000 mg | ORAL_TABLET | Freq: Every day | ORAL | 2 refills | Status: DC

## 2018-10-09 NOTE — Patient Instructions (Signed)

## 2018-10-11 NOTE — Progress Notes (Signed)
Subjective:   Patient ID: Spencer Lloyd, male   DOB: 73 y.o.   MRN: [id number]   HPI Patient presents with exquisite discomfort plantar aspect right heel at the insertional point of the tendon that is been going on for around 4 months with no history of injury.  Patient has tried inserts wedges and shoe gear modifications without relief and does not smoke and likes to be active   Review of Systems  All other systems reviewed and are negative.       Objective:  Physical Exam Vitals signs and nursing note reviewed.  Constitutional:      Appearance: He is well-developed.  Pulmonary:     Effort: Pulmonary effort is normal.  Musculoskeletal: Normal range of motion.  Skin:    General: Skin is warm.  Neurological:     Mental Status: He is alert.     Neurovascular status intact muscle strength is adequate range of motion within normal limits with patient noted to have exquisite discomfort plantar aspect of the right heel at the insertional point tendon into the calcaneus with inflammation fluid around the medial band.  Moderate depression of the arch is noted.  Patient did have good digital perfusion and is well oriented x3     Assessment:  Acute plantar fasciitis right at the insertion of the tendon into the calcaneus right with no indication of stress fracture     Plan:  H&P x-ray reviewed and today I injected the plantar fascial right 3 mg Kenalog 5 mg Xylocaine and applied fascial brace with instructions on usage along with supportive shoe gear.  Patient will be seen back to recheck  X-ray indicates no signs of spur or stress fracture of the right foot

## 2018-10-23 ENCOUNTER — Ambulatory Visit: Payer: Medicare Other | Admitting: Podiatry

## 2018-10-23 ENCOUNTER — Encounter: Payer: Self-pay | Admitting: Podiatry

## 2018-10-23 DIAGNOSIS — M722 Plantar fascial fibromatosis: Secondary | ICD-10-CM

## 2018-10-23 NOTE — Progress Notes (Signed)
Subjective:   Patient ID: Spencer Lloyd, male   DOB: 73 y.o.   MRN: [id number]   HPI Patient states my heel is feeling quite a bit better with mild discomfort and I have a number of questions   ROS      Objective:  Physical Exam  Neurovascular status intact range of motion muscle strength good with patient right heel doing much better with mild to moderate discomfort upon deep palpation     Assessment:  Plantar fasciitis still present right with improvement but pain still noted upon deep palpation      Plan:  H&P condition reviewed and discussed at great length the causes of plantar fasciitis different treatment options and shoe gear modifications including elevated heel and support.  Patient is discharged but will reappoint if symptoms were to recur and may require other treatment options

## 2019-12-03 DIAGNOSIS — D1801 Hemangioma of skin and subcutaneous tissue: Secondary | ICD-10-CM | POA: Diagnosis not present

## 2019-12-03 DIAGNOSIS — C44629 Squamous cell carcinoma of skin of left upper limb, including shoulder: Secondary | ICD-10-CM | POA: Diagnosis not present

## 2019-12-03 DIAGNOSIS — Z85820 Personal history of malignant melanoma of skin: Secondary | ICD-10-CM | POA: Diagnosis not present

## 2019-12-03 DIAGNOSIS — L821 Other seborrheic keratosis: Secondary | ICD-10-CM | POA: Diagnosis not present

## 2019-12-03 DIAGNOSIS — Z85828 Personal history of other malignant neoplasm of skin: Secondary | ICD-10-CM | POA: Diagnosis not present

## 2019-12-03 DIAGNOSIS — L814 Other melanin hyperpigmentation: Secondary | ICD-10-CM | POA: Diagnosis not present

## 2019-12-03 DIAGNOSIS — D485 Neoplasm of uncertain behavior of skin: Secondary | ICD-10-CM | POA: Diagnosis not present

## 2019-12-03 DIAGNOSIS — L570 Actinic keratosis: Secondary | ICD-10-CM | POA: Diagnosis not present

## 2019-12-24 DIAGNOSIS — Z961 Presence of intraocular lens: Secondary | ICD-10-CM | POA: Diagnosis not present

## 2019-12-24 DIAGNOSIS — H401133 Primary open-angle glaucoma, bilateral, severe stage: Secondary | ICD-10-CM | POA: Diagnosis not present

## 2020-03-05 DIAGNOSIS — E7800 Pure hypercholesterolemia, unspecified: Secondary | ICD-10-CM | POA: Diagnosis not present

## 2020-03-05 DIAGNOSIS — R05 Cough: Secondary | ICD-10-CM | POA: Diagnosis not present

## 2020-03-05 DIAGNOSIS — I10 Essential (primary) hypertension: Secondary | ICD-10-CM | POA: Diagnosis not present

## 2020-03-05 DIAGNOSIS — Z85828 Personal history of other malignant neoplasm of skin: Secondary | ICD-10-CM | POA: Diagnosis not present

## 2020-03-05 DIAGNOSIS — Z85820 Personal history of malignant melanoma of skin: Secondary | ICD-10-CM | POA: Diagnosis not present

## 2020-03-05 DIAGNOSIS — R7303 Prediabetes: Secondary | ICD-10-CM | POA: Diagnosis not present

## 2020-03-05 DIAGNOSIS — Z86010 Personal history of colonic polyps: Secondary | ICD-10-CM | POA: Diagnosis not present

## 2020-03-05 DIAGNOSIS — H6120 Impacted cerumen, unspecified ear: Secondary | ICD-10-CM | POA: Diagnosis not present

## 2020-03-05 DIAGNOSIS — H409 Unspecified glaucoma: Secondary | ICD-10-CM | POA: Diagnosis not present

## 2020-04-17 ENCOUNTER — Ambulatory Visit: Payer: Medicare Other | Admitting: Emergency Medicine

## 2020-04-21 ENCOUNTER — Other Ambulatory Visit: Payer: Self-pay

## 2020-04-21 ENCOUNTER — Encounter: Payer: Self-pay | Admitting: Emergency Medicine

## 2020-04-21 ENCOUNTER — Ambulatory Visit (INDEPENDENT_AMBULATORY_CARE_PROVIDER_SITE_OTHER): Payer: Medicare PPO | Admitting: Emergency Medicine

## 2020-04-21 ENCOUNTER — Ambulatory Visit (INDEPENDENT_AMBULATORY_CARE_PROVIDER_SITE_OTHER): Payer: Medicare PPO

## 2020-04-21 DIAGNOSIS — R05 Cough: Secondary | ICD-10-CM

## 2020-04-21 DIAGNOSIS — R053 Chronic cough: Secondary | ICD-10-CM

## 2020-04-21 DIAGNOSIS — J4520 Mild intermittent asthma, uncomplicated: Secondary | ICD-10-CM

## 2020-04-21 MED ORDER — LORATADINE 10 MG PO TABS: 10.0000 mg | ORAL_TABLET | Freq: Every day | ORAL | 11 refills | Status: DC

## 2020-04-21 MED ORDER — PREDNISONE 10 MG PO TABS: 30.0000 mg | ORAL_TABLET | Freq: Every day | ORAL | 0 refills | Status: AC

## 2020-04-21 MED ORDER — BENZONATATE 100 MG PO CAPS: 100.0000 mg | ORAL_CAPSULE | Freq: Four times a day (QID) | ORAL | 1 refills | Status: DC | PRN

## 2020-04-21 MED ORDER — ALBUTEROL SULFATE HFA 108 (90 BASE) MCG/ACT IN AERS: 2.0000 | INHALATION_SPRAY | RESPIRATORY_TRACT | 5 refills | Status: DC | PRN

## 2020-04-21 MED ORDER — FLUTICASONE PROPIONATE 50 MCG/ACT NA SUSP: 1.0000 | Freq: Every day | NASAL | 2 refills | Status: DC

## 2020-04-21 NOTE — Assessment & Plan Note (Addendum)
He has dyspnea and wheeze that he can differentiate from his chronic cough.  Question whether he has progressive obstruction.  We will check PFT.  Continue albuterol as needed for now.  He may benefit from scheduled ICS or bronchodilator therapy going forward depending on results.

## 2020-04-21 NOTE — Assessment & Plan Note (Addendum)
Chest x-ray today, he believes that a significant part of this is the burden of chronic rhinitis and drainage.  He is not currently on any medication for this.  He is tolerated decongestants poorly in the past but can probably take a nonsedating antihistamine.  We will start loratadine, add fluticasone nasal spray and see if he gets any improvement.  Continue the omeprazole.  He does not feel that he is having any breakthrough GERD symptoms.  Once he is on these I will give him prednisone to see if we can decrease his upper airway irritation.  Tessalon for cough suppression.  Certainly possible that some of his cough is asthmatic in nature.  I will repeat his pulmonary function testing as below.  Continue his albuterol

## 2020-04-21 NOTE — Progress Notes (Signed)
Subjective:    Patient ID: Kristeen Miss, male    DOB: 22-Apr-1946, 74 y.o.   MRN: [id number]  HPI 74 year old man, never smoker with history of hypertension, prediabetes, GERD, glaucoma.  He carries a history of mild intermittent asthma that was made by PFT in July 2018.  I last saw him at that time.  He returns today reporting that his cough has never really ever gone away.  Lately cough has been happening through much of the day, hears wheeze and has some SOB at night when he lays down. Also feeling some SOB and wheeze during.  He has to cough up mucous most mornings. Clear thick mucous with most of his coughing. He has albuterol and does get relief of his wheeze, SOB. His activity level is stable. He has a globus sensation. Some nasal congestion but minimal. Antihistamines give him some lightheadedness - OTC decongestants. He doesn't believe he is having any reflux. He tried omeprazole for the last 6 weeks, didn't make much difference.    Review of Systems As per HPI  Past Medical History:  Diagnosis Date  . Benign localized prostatic hyperplasia with lower urinary tract symptoms (LUTS)   . Diverticulosis   . GERD (gastroesophageal reflux disease)    watch diet  . Glaucoma, both eyes   . History of adenomatous polyp of colon   . History of syncope    per pt has hx of syncopal episodes due to orthostatic hypotension,  last one 04/ 2019 and before that 2017  . Hypercholesteremia   . Hypertension   . Mild intermittent asthma   . Personal history of malignant melanoma 1998   s/p  wide local excision from mid lower back ,  per pt sln's negative and no recurrence  . Prediabetes   . Wears glasses      No family history on file.   Social History   Socioeconomic History  . Marital status: Married    Spouse name: Not on file  . Number of children: Not on file  . Years of education: Not on file  . Highest education level: Not on file  Occupational History  . Not on file   Tobacco Use  . Smoking status: Never Smoker  . Smokeless tobacco: Never Used  Vaping Use  . Vaping Use: Never used  Substance and Sexual Activity  . Alcohol use: Yes    Alcohol/week: 0.0 standard drinks    Comment: occas.  . Drug use: Never  . Sexual activity: Not on file    Comment: vasectomy  Other Topics Concern  . Not on file  Social History Narrative  . Not on file   Social Determinants of Health   Financial Resource Strain:   . Difficulty of Paying Living Expenses: Not on file  Food Insecurity:   . Worried About Charity fundraiser in the Last Year: Not on file  . Ran Out of Food in the Last Year: Not on file  Transportation Needs:   . Lack of Transportation (Medical): Not on file  . Lack of Transportation (Non-Medical): Not on file  Physical Activity:   . Days of Exercise per Week: Not on file  . Minutes of Exercise per Session: Not on file  Stress:   . Feeling of Stress : Not on file  Social Connections:   . Frequency of Communication with Friends and Family: Not on file  . Frequency of Social Gatherings with Friends and Family: Not on file  .  Attends Religious Services: Not on file  . Active Member of Clubs or Organizations: Not on file  . Attends Archivist Meetings: Not on file  . Marital Status: Not on file  Intimate Partner Violence:   . Fear of Current or Ex-Partner: Not on file  . Emotionally Abused: Not on file  . Physically Abused: Not on file  . Sexually Abused: Not on file     No Known Allergies   Outpatient Medications Prior to Visit  Medication Sig Dispense Refill  . albuterol (PROVENTIL HFA;VENTOLIN HFA) 108 (90 Base) MCG/ACT inhaler Inhale 2 puffs into the lungs every 4 (four) hours as needed for wheezing or shortness of breath. 1 Inhaler 5  . ALPHAGAN P 0.1 % SOLN Place 1 drop into both eyes 2 (two) times daily.     Marland Kitchen amLODipine (NORVASC) 5 MG tablet Take 5 mg by mouth at bedtime.     . dorzolamide-timolol (COSOPT) 22.3-6.8  MG/ML ophthalmic solution Place 1 drop into both eyes 2 (two) times daily.     . Netarsudil-Latanoprost (ROCKLATAN) 0.02-0.005 % SOLN Place 1 drop into both eyes at bedtime.    Marland Kitchen acetaZOLAMIDE (DIAMOX) 250 MG tablet Take 250 mg by mouth 2 (two) times daily.    . meloxicam (MOBIC) 15 MG tablet Take 15 mg by mouth daily. (Patient not taking: Reported on 04/21/2020)    . omeprazole (PRILOSEC) 20 MG capsule  (Patient not taking: Reported on 04/21/2020)    . pravastatin (PRAVACHOL) 40 MG tablet Take 40 mg by mouth at bedtime.  (Patient not taking: Reported on 04/21/2020)     No facility-administered medications prior to visit.        Objective:   Physical Exam Vitals:   04/21/20 1429  BP: (!) 148/82  Pulse: 63  Temp: (!) 97.3 F (36.3 C)  TempSrc: Temporal  SpO2: 97%  Weight: 165 lb (74.8 kg)  Height: 5\' 7"  (1.702 m)   Gen: Pleasant, well-nourished, in no distress,  normal affect  ENT: No lesions,  mouth clear,  oropharynx clear, no postnasal drip  Neck: No JVD, no stridor  Lungs: No use of accessory muscles, soft end expiratory wheeze bilaterally  Cardiovascular: RRR, heart sounds normal, no murmur or gallops, no peripheral edema  Musculoskeletal: No deformities, no cyanosis or clubbing  Neuro: alert, awake, non focal  Skin: Warm, no lesions or rash     Assessment & Plan:  Chronic cough Chest x-ray today, he believes that a significant part of this is the burden of chronic rhinitis and drainage.  He is not currently on any medication for this.  He is tolerated decongestants poorly in the past but can probably take a nonsedating antihistamine.  We will start loratadine, add fluticasone nasal spray and see if he gets any improvement.  Continue the omeprazole.  He does not feel that he is having any breakthrough GERD symptoms.  Once he is on these I will give him prednisone to see if we can decrease his upper airway irritation.  Tessalon for cough suppression.  Certainly possible  that some of his cough is asthmatic in nature.  I will repeat his pulmonary function testing as below.  Continue his albuterol  Mild intermittent asthma He has dyspnea and wheeze that he can differentiate from his chronic cough.  Question whether he has progressive obstruction.  We will check PFT.  Continue albuterol as needed for now.  He may benefit from scheduled ICS or bronchodilator therapy going forward depending on results.  Baltazar Apo, MD, PhD 04/21/2020, 3:06 PM  Pulmonary and [facility] Care [phone] or if no answer [phone]

## 2020-04-21 NOTE — Patient Instructions (Addendum)
Chest x-ray today Pulmonary function testing in next office visit Continue omeprazole 20 mg once daily.  Take this medication 1 hour around food. Please start loratadine 10 mg (Claritin) once daily until next visit Please start fluticasone nasal spray, 2 sprays each nostril once daily until next visit When she has gotten the medicines above started, take prednisone 30 mg daily for 5 days. Use Tessalon Perles 200 mg up to every 6 hours if needed for cough suppression Try to avoid coughing and throat clearing if at all possible Keep your albuterol available to use 2 puffs up to every 4 hours if needed for shortness of breath, chest tightness, wheezing.  We will refill this for you today. Follow with Dr. Lamonte Sakai next available with full pulmonary function testing on the same day.

## 2020-04-22 ENCOUNTER — Telehealth: Payer: Self-pay | Admitting: Emergency Medicine

## 2020-04-22 MED ORDER — BENZONATATE 200 MG PO CAPS: 200.0000 mg | ORAL_CAPSULE | Freq: Four times a day (QID) | ORAL | 1 refills | Status: DC

## 2020-04-22 NOTE — Telephone Encounter (Signed)
Yes it should be OK. The systemic absorption of the medication is negligible

## 2020-04-22 NOTE — Telephone Encounter (Signed)
Patient Instructions by Collene Gobble, MD at 04/21/2020 2:30 PM Author: Collene Gobble, MD Author Type: Physician Filed: 04/21/2020 3:04 PM  Note Status: Addendum Cosign: Cosign Not Required Encounter Date: 04/21/2020  Editor: Collene Gobble, MD (Physician)      Prior Versions: 1. Collene Gobble, MD (Physician) at 04/21/2020 3:02 PM - Signed      Chest x-ray today Pulmonary function testing in next office visit Continue omeprazole 20 mg once daily.  Take this medication 1 hour around food. Please start loratadine 10 mg (Claritin) once daily until next visit Please start fluticasone nasal spray, 2 sprays each nostril once daily until next visit When she has gotten the medicines above started, take prednisone 30 mg daily for 5 days. Use Tessalon Perles 200 mg up to every 6 hours if needed for cough suppression Try to avoid coughing and throat clearing if at all possible Keep your albuterol available to use 2 puffs up to every 4 hours if needed for shortness of breath, chest tightness, wheezing.  We will refill this for you today. Follow with Dr. Lamonte Sakai next available with full pulmonary function testing on the same day.      Called and spoke with pt who stated that he went to pharmacy to pick up benzonatate after it had been sent to pharmacy after OV. Pt stated that the Rx was for benzonatate 100mg  but he thought that RB had wanted to prescribe 200mg . I looked at [place] and also at the med that had been prescribed and the wrong dose had been sent to pharmacy for pt after OV. Stated to pt that I would send corrected dose to pharmacy for him he verbalized understanding.   While speaking with he also stated that he does have bad glaucoma that is being treated but wants to know if it is okay for him to use the flonase nasal spray. Dr. Lamonte Sakai, please advise about the flonase.

## 2020-04-22 NOTE — Telephone Encounter (Signed)
Spoke with pt, aware of RB's recs.  Nothing further needed at this time- will close encounter.

## 2020-05-02 DIAGNOSIS — Z961 Presence of intraocular lens: Secondary | ICD-10-CM | POA: Diagnosis not present

## 2020-05-02 DIAGNOSIS — H401133 Primary open-angle glaucoma, bilateral, severe stage: Secondary | ICD-10-CM | POA: Diagnosis not present

## 2020-06-04 ENCOUNTER — Other Ambulatory Visit: Payer: Self-pay

## 2020-06-04 ENCOUNTER — Encounter: Payer: Self-pay | Admitting: Emergency Medicine

## 2020-06-04 ENCOUNTER — Ambulatory Visit (INDEPENDENT_AMBULATORY_CARE_PROVIDER_SITE_OTHER): Payer: Medicare PPO | Admitting: Emergency Medicine

## 2020-06-04 ENCOUNTER — Ambulatory Visit: Payer: Medicare PPO | Admitting: Emergency Medicine

## 2020-06-04 DIAGNOSIS — D1801 Hemangioma of skin and subcutaneous tissue: Secondary | ICD-10-CM | POA: Diagnosis not present

## 2020-06-04 DIAGNOSIS — J4520 Mild intermittent asthma, uncomplicated: Secondary | ICD-10-CM

## 2020-06-04 DIAGNOSIS — L570 Actinic keratosis: Secondary | ICD-10-CM | POA: Diagnosis not present

## 2020-06-04 DIAGNOSIS — L821 Other seborrheic keratosis: Secondary | ICD-10-CM | POA: Diagnosis not present

## 2020-06-04 DIAGNOSIS — R053 Chronic cough: Secondary | ICD-10-CM

## 2020-06-04 DIAGNOSIS — Z85828 Personal history of other malignant neoplasm of skin: Secondary | ICD-10-CM | POA: Diagnosis not present

## 2020-06-04 DIAGNOSIS — Z85820 Personal history of malignant melanoma of skin: Secondary | ICD-10-CM | POA: Diagnosis not present

## 2020-06-04 LAB — PULMONARY FUNCTION TEST
DL/VA % pred: 109 %
DL/VA: 4.47 ml/min/mmHg/L
DLCO cor % pred: 103 %
DLCO cor: 23.2 ml/min/mmHg
DLCO unc % pred: 103 %
DLCO unc: 23.2 ml/min/mmHg
FEF 25-75 Post: 1.41 L/sec
FEF 25-75 Pre: 1.36 L/sec
FEF2575-%Change-Post: 3 %
FEF2575-%Pred-Post: 71 %
FEF2575-%Pred-Pre: 69 %
FEV1-%Change-Post: -1 %
FEV1-%Pred-Post: 82 %
FEV1-%Pred-Pre: 84 %
FEV1-Post: 2.18 L
FEV1-Pre: 2.23 L
FEV1FVC-%Change-Post: -4 %
FEV1FVC-%Pred-Pre: 89 %
FEV6-%Change-Post: 1 %
FEV6-%Pred-Post: 99 %
FEV6-%Pred-Pre: 98 %
FEV6-Post: 3.41 L
FEV6-Pre: 3.35 L
FEV6FVC-%Change-Post: 0 %
FEV6FVC-%Pred-Post: 105 %
FEV6FVC-%Pred-Pre: 105 %
FVC-%Change-Post: 3 %
FVC-%Pred-Post: 95 %
FVC-%Pred-Pre: 92 %
FVC-Post: 3.5 L
FVC-Pre: 3.4 L
Post FEV1/FVC ratio: 62 %
Post FEV6/FVC ratio: 99 %
Pre FEV1/FVC ratio: 66 %
Pre FEV6/FVC Ratio: 99 %
RV % pred: 100 %
RV: 2.3 L
TLC % pred: 91 %
TLC: 5.68 L

## 2020-06-04 NOTE — Progress Notes (Signed)
PFT done today. 

## 2020-06-04 NOTE — Assessment & Plan Note (Signed)
Nasal drainage and congestion seem to be driving his cough.  Has improved on loratadine.  He got off nasal steroid on the recommendation of his ophthalmologist.  He might benefit from nasal saline rinses.  He had a paradoxical reaction to PPI, actually made his reflux worse.  We will not restart.  Continue loratadine (Claritin) 10 mg once daily. Stop fluticasone nasal spray You may want to try nasal saline rinses once a day to see if this helps decrease your mucus drainage and helps prevent cough. We will not restart a stomach acid medication at this time.

## 2020-06-04 NOTE — Patient Instructions (Signed)
Your pulmonary function testing today confirms mild asthma, unchanged compared with your priors.  This is good news. Keep your albuterol available use 2 puffs when you need it for shortness of breath.  Continue to use it before significant exertion as you have been doing. Continue loratadine (Claritin) 10 mg once daily. Stop fluticasone nasal spray You may want to try nasal saline rinses once a day to see if this helps decrease your mucus drainage and helps prevent cough. We will not restart a stomach acid medication at this time. Follow with Dr. Lamonte Sakai in 12 months or sooner if you have any problems.

## 2020-06-04 NOTE — Progress Notes (Signed)
Subjective:    Patient ID: Spencer Lloyd, male    DOB: 25-Feb-1946, 74 y.o.   MRN: [id number]  HPI 74 year old man, never smoker with history of hypertension, prediabetes, GERD, glaucoma.  He carries a history of mild intermittent asthma that was made by PFT in July 2018.  I last saw him at that time.  He returns today reporting that his cough has never really ever gone away.  Lately cough has been happening through much of the day, hears wheeze and has some SOB at night when he lays down. Also feeling some SOB and wheeze during.  He has to cough up mucous most mornings. Clear thick mucous with most of his coughing. He has albuterol and does get relief of his wheeze, SOB. His activity level is stable. He has a globus sensation. Some nasal congestion but minimal. Antihistamines give him some lightheadedness - OTC decongestants. He doesn't believe he is having any reflux. He tried omeprazole for the last 6 weeks, didn't make much difference.   ROV 06/04/20 --follow-up visit for 74 year old gentleman, never smoker with hypertension, GERD, glaucoma, mild intermittent asthma.  He has experienced chronic cough and this prompted him to reestablish care at the end of August..  Last time we started loratadine and fluticasone nasal spray because he was having significant nasal drainage.  He was on omeprazole and we continued this.  I also tried him on a short course of prednisone to see if this would give him any benefit.  He reports today that he has had some acid reflux that seemed to paradoxically be worse on omeprazole. He did the fluticasone for about 12 days, stopped it because his opthalmologist was worried about his glaucoma. He remains on loratadine. Still has cough but less than before. But not gone. He uses albuterol several times a week, usually w strenuous activity.   Pulmonary function testing done today reviewed by me, show mild obstruction without a bronchodilator response, FEV1 2.23 L (84%  predicted).  Lung volumes and diffusion capacity were both normal.  There was curve of his flow volume loop consistent with obstruction. CXR 04/22/20 clear    Review of Systems As per HPI       Objective:   Physical Exam Vitals:   06/04/20 1217  BP: 118/72  Pulse: 60  Temp: 98.4 F (36.9 C)  TempSrc: Oral  SpO2: 98%  Weight: 164 lb 3.2 oz (74.5 kg)  Height: 5\' 6"  (1.676 m)   Gen: Pleasant, well-nourished, in no distress,  normal affect  ENT: No lesions,  mouth clear,  oropharynx clear, no postnasal drip  Neck: No JVD, no stridor  Lungs: No use of accessory muscles, soft end expiratory wheeze bilaterally  Cardiovascular: RRR, heart sounds normal, no murmur or gallops, no peripheral edema  Musculoskeletal: No deformities, no cyanosis or clubbing  Neuro: alert, awake, non focal  Skin: Warm, no lesions or rash     Assessment & Plan:  Mild intermittent asthma Your pulmonary function testing today confirms mild asthma, unchanged compared with your priors.  This is good news. Keep your albuterol available use 2 puffs when you need it for shortness of breath.  Continue to use it before significant exertion as you have been doing. Follow with Dr. Lamonte Sakai in 12 months or sooner if you have any problems.   Chronic cough Nasal drainage and congestion seem to be driving his cough.  Has improved on loratadine.  He got off nasal steroid on the recommendation of his  ophthalmologist.  He might benefit from nasal saline rinses.  He had a paradoxical reaction to PPI, actually made his reflux worse.  We will not restart.  Continue loratadine (Claritin) 10 mg once daily. Stop fluticasone nasal spray You may want to try nasal saline rinses once a day to see if this helps decrease your mucus drainage and helps prevent cough. We will not restart a stomach acid medication at this time.   Baltazar Apo, MD, PhD 06/04/2020, 12:36 PM [facility] [phone] or if no  answer [phone]

## 2020-06-04 NOTE — Assessment & Plan Note (Signed)
Your pulmonary function testing today confirms mild asthma, unchanged compared with your priors.  This is good news. Keep your albuterol available use 2 puffs when you need it for shortness of breath.  Continue to use it before significant exertion as you have been doing. Follow with Dr. Lamonte Sakai in 12 months or sooner if you have any problems.

## 2020-08-29 DIAGNOSIS — H409 Unspecified glaucoma: Secondary | ICD-10-CM | POA: Diagnosis not present

## 2020-08-29 DIAGNOSIS — Z85828 Personal history of other malignant neoplasm of skin: Secondary | ICD-10-CM | POA: Diagnosis not present

## 2020-08-29 DIAGNOSIS — Z85820 Personal history of malignant melanoma of skin: Secondary | ICD-10-CM | POA: Diagnosis not present

## 2020-08-29 DIAGNOSIS — Z86010 Personal history of colonic polyps: Secondary | ICD-10-CM | POA: Diagnosis not present

## 2020-08-29 DIAGNOSIS — I10 Essential (primary) hypertension: Secondary | ICD-10-CM | POA: Diagnosis not present

## 2020-08-29 DIAGNOSIS — R7303 Prediabetes: Secondary | ICD-10-CM | POA: Diagnosis not present

## 2020-08-29 DIAGNOSIS — R059 Cough, unspecified: Secondary | ICD-10-CM | POA: Diagnosis not present

## 2020-08-29 DIAGNOSIS — Z0000 Encounter for general adult medical examination without abnormal findings: Secondary | ICD-10-CM | POA: Diagnosis not present

## 2020-08-29 DIAGNOSIS — E7800 Pure hypercholesterolemia, unspecified: Secondary | ICD-10-CM | POA: Diagnosis not present

## 2020-09-03 DIAGNOSIS — H401133 Primary open-angle glaucoma, bilateral, severe stage: Secondary | ICD-10-CM | POA: Diagnosis not present

## 2020-09-03 DIAGNOSIS — Z961 Presence of intraocular lens: Secondary | ICD-10-CM | POA: Diagnosis not present

## 2020-12-15 DIAGNOSIS — L814 Other melanin hyperpigmentation: Secondary | ICD-10-CM | POA: Diagnosis not present

## 2020-12-15 DIAGNOSIS — Z85828 Personal history of other malignant neoplasm of skin: Secondary | ICD-10-CM | POA: Diagnosis not present

## 2020-12-15 DIAGNOSIS — L570 Actinic keratosis: Secondary | ICD-10-CM | POA: Diagnosis not present

## 2020-12-15 DIAGNOSIS — L821 Other seborrheic keratosis: Secondary | ICD-10-CM | POA: Diagnosis not present

## 2020-12-15 DIAGNOSIS — Z85820 Personal history of malignant melanoma of skin: Secondary | ICD-10-CM | POA: Diagnosis not present

## 2020-12-15 DIAGNOSIS — L820 Inflamed seborrheic keratosis: Secondary | ICD-10-CM | POA: Diagnosis not present

## 2020-12-22 DIAGNOSIS — H401133 Primary open-angle glaucoma, bilateral, severe stage: Secondary | ICD-10-CM | POA: Diagnosis not present

## 2020-12-22 DIAGNOSIS — Z961 Presence of intraocular lens: Secondary | ICD-10-CM | POA: Diagnosis not present

## 2020-12-29 ENCOUNTER — Other Ambulatory Visit: Payer: Self-pay | Admitting: Emergency Medicine

## 2021-01-06 DIAGNOSIS — M7711 Lateral epicondylitis, right elbow: Secondary | ICD-10-CM | POA: Diagnosis not present

## 2021-01-06 DIAGNOSIS — M2390 Unspecified internal derangement of unspecified knee: Secondary | ICD-10-CM | POA: Diagnosis not present

## 2021-01-07 DIAGNOSIS — M7711 Lateral epicondylitis, right elbow: Secondary | ICD-10-CM | POA: Diagnosis not present

## 2021-01-07 DIAGNOSIS — M25462 Effusion, left knee: Secondary | ICD-10-CM | POA: Diagnosis not present

## 2021-01-07 DIAGNOSIS — M25562 Pain in left knee: Secondary | ICD-10-CM | POA: Diagnosis not present

## 2021-01-07 DIAGNOSIS — M1712 Unilateral primary osteoarthritis, left knee: Secondary | ICD-10-CM | POA: Diagnosis not present

## 2021-01-28 DIAGNOSIS — S83242A Other tear of medial meniscus, current injury, left knee, initial encounter: Secondary | ICD-10-CM | POA: Diagnosis not present

## 2021-01-31 DIAGNOSIS — M25562 Pain in left knee: Secondary | ICD-10-CM | POA: Diagnosis not present

## 2021-02-04 DIAGNOSIS — S83242D Other tear of medial meniscus, current injury, left knee, subsequent encounter: Secondary | ICD-10-CM | POA: Diagnosis not present

## 2021-02-20 ENCOUNTER — Encounter: Payer: Self-pay | Admitting: Podiatry

## 2021-02-20 ENCOUNTER — Other Ambulatory Visit: Payer: Self-pay

## 2021-02-20 ENCOUNTER — Ambulatory Visit: Payer: Medicare PPO | Admitting: Podiatry

## 2021-02-20 DIAGNOSIS — L600 Ingrowing nail: Secondary | ICD-10-CM | POA: Diagnosis not present

## 2021-02-23 NOTE — Progress Notes (Signed)
Subjective:   Patient ID: Spencer Lloyd, male   DOB: 75 y.o.   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  At this point we will take a watch and see attitude

## 2021-02-24 DIAGNOSIS — R059 Cough, unspecified: Secondary | ICD-10-CM | POA: Diagnosis not present

## 2021-02-24 DIAGNOSIS — Z85820 Personal history of malignant melanoma of skin: Secondary | ICD-10-CM | POA: Diagnosis not present

## 2021-02-24 DIAGNOSIS — Z85828 Personal history of other malignant neoplasm of skin: Secondary | ICD-10-CM | POA: Diagnosis not present

## 2021-02-24 DIAGNOSIS — R7303 Prediabetes: Secondary | ICD-10-CM | POA: Diagnosis not present

## 2021-02-24 DIAGNOSIS — E7800 Pure hypercholesterolemia, unspecified: Secondary | ICD-10-CM | POA: Diagnosis not present

## 2021-02-24 DIAGNOSIS — I10 Essential (primary) hypertension: Secondary | ICD-10-CM | POA: Diagnosis not present

## 2021-02-24 DIAGNOSIS — Z86010 Personal history of colonic polyps: Secondary | ICD-10-CM | POA: Diagnosis not present

## 2021-02-24 DIAGNOSIS — H409 Unspecified glaucoma: Secondary | ICD-10-CM | POA: Diagnosis not present

## 2021-04-22 DIAGNOSIS — I10 Essential (primary) hypertension: Secondary | ICD-10-CM | POA: Diagnosis not present

## 2021-04-29 DIAGNOSIS — H401133 Primary open-angle glaucoma, bilateral, severe stage: Secondary | ICD-10-CM | POA: Diagnosis not present

## 2021-04-29 DIAGNOSIS — Z961 Presence of intraocular lens: Secondary | ICD-10-CM | POA: Diagnosis not present

## 2021-05-08 ENCOUNTER — Other Ambulatory Visit: Payer: Self-pay | Admitting: Emergency Medicine

## 2021-05-12 ENCOUNTER — Telehealth: Payer: Self-pay | Admitting: Emergency Medicine

## 2021-05-12 NOTE — Telephone Encounter (Signed)
Called and spoke with patient regarding refill request. Advised him that refill has been sent into the pharmacy. Sent enough until his appointment in October. Patient verbalized understanding.  Nothing further needed at this time.

## 2021-06-15 DIAGNOSIS — L570 Actinic keratosis: Secondary | ICD-10-CM | POA: Diagnosis not present

## 2021-06-15 DIAGNOSIS — Z85820 Personal history of malignant melanoma of skin: Secondary | ICD-10-CM | POA: Diagnosis not present

## 2021-06-15 DIAGNOSIS — Z85828 Personal history of other malignant neoplasm of skin: Secondary | ICD-10-CM | POA: Diagnosis not present

## 2021-06-15 DIAGNOSIS — D1721 Benign lipomatous neoplasm of skin and subcutaneous tissue of right arm: Secondary | ICD-10-CM | POA: Diagnosis not present

## 2021-06-15 DIAGNOSIS — L820 Inflamed seborrheic keratosis: Secondary | ICD-10-CM | POA: Diagnosis not present

## 2021-06-15 DIAGNOSIS — L821 Other seborrheic keratosis: Secondary | ICD-10-CM | POA: Diagnosis not present

## 2021-06-15 DIAGNOSIS — D1801 Hemangioma of skin and subcutaneous tissue: Secondary | ICD-10-CM | POA: Diagnosis not present

## 2021-06-18 ENCOUNTER — Encounter: Payer: Self-pay | Admitting: Emergency Medicine

## 2021-06-18 ENCOUNTER — Other Ambulatory Visit: Payer: Self-pay

## 2021-06-18 ENCOUNTER — Ambulatory Visit: Payer: Medicare PPO | Admitting: Emergency Medicine

## 2021-06-18 DIAGNOSIS — R053 Chronic cough: Secondary | ICD-10-CM | POA: Diagnosis not present

## 2021-06-18 DIAGNOSIS — J4520 Mild intermittent asthma, uncomplicated: Secondary | ICD-10-CM | POA: Diagnosis not present

## 2021-06-18 MED ORDER — LORATADINE 10 MG PO TABS: 10.0000 mg | ORAL_TABLET | Freq: Every day | ORAL | 3 refills | Status: DC

## 2021-06-18 NOTE — Assessment & Plan Note (Signed)
Improved significantly.  Correlates with better control of his rhinitis, allergies.  He has tolerated stopping nasal steroid, is only on daily loratadine.  Plan to continue this regimen.

## 2021-06-18 NOTE — Assessment & Plan Note (Signed)
No flares.  Minimal daytime symptoms.  Rare albuterol use.  He does pretreat exercise which I believe is prudent.  Due for his COVID-19 booster this fall.  He does not usually get the flu shot.  Plan to follow annually or sooner if he has flaring or any new problems.

## 2021-06-18 NOTE — Progress Notes (Signed)
   Subjective:    Patient ID: Spencer Lloyd, male    DOB: Dec 05, 1945, 75 y.o.   MRN: [id number]  HPI \ ROV 06/04/20 --follow-up visit for 75 year old gentleman, never smoker with hypertension, GERD, glaucoma, mild intermittent asthma.  He has experienced chronic cough and this prompted him to reestablish care at the end of August..  Last time we started loratadine and fluticasone nasal spray because he was having significant nasal drainage.  He was on omeprazole and we continued this.  I also tried him on a short course of prednisone to see if this would give him any benefit.  He reports today that he has had some acid reflux that seemed to paradoxically be worse on omeprazole. He did the fluticasone for about 12 days, stopped it because his opthalmologist was worried about his glaucoma. He remains on loratadine. Still has cough but less than before. But not gone. He uses albuterol several times a week, usually w strenuous activity.   Pulmonary function testing done today reviewed by me, show mild obstruction without a bronchodilator response, FEV1 2.23 L (84% predicted).  Lung volumes and diffusion capacity were both normal.  There was curve of his flow volume loop consistent with obstruction. CXR 04/22/20 clear   ROV 06/18/21 --75 year old man, never smoker whom I have seen for mild intermittent asthma and chronic cough.  Believe that this has been related to significant postnasal drip and drainage, probably also a component of GERD. Today he describes that he has been doing well. No cough. His nasal drainage is well controlled.  He has albuterol available to use if needed, uses rarely. Sometimes pretreats exercise. No flares. He did have COVID in July 2022.  He is on loratadine 10 mg daily Planning for the COVID booster this fall.    Review of Systems As per HPI      Objective:   Physical Exam Vitals:   06/18/21 1535  BP: (!) 146/84  Pulse: 64  SpO2: 99%  Weight: 169 lb (76.7 kg)   Height: 5\' 7"  (1.702 m)   Gen: Pleasant, well-nourished, in no distress,  normal affect  ENT: No lesions,  mouth clear,  oropharynx clear, no postnasal drip  Neck: No JVD, no stridor  Lungs: No use of accessory muscles, soft end expiratory wheeze bilaterally  Cardiovascular: RRR, heart sounds normal, no murmur or gallops, no peripheral edema  Musculoskeletal: No deformities, no cyanosis or clubbing  Neuro: alert, awake, non focal  Skin: Warm, no lesions or rash     Assessment & Plan:  Chronic cough Improved significantly.  Correlates with better control of his rhinitis, allergies.  He has tolerated stopping nasal steroid, is only on daily loratadine.  Plan to continue this regimen.  Mild intermittent asthma No flares.  Minimal daytime symptoms.  Rare albuterol use.  He does pretreat exercise which I believe is prudent.  Due for his COVID-19 booster this fall.  He does not usually get the flu shot.  Plan to follow annually or sooner if he has flaring or any new problems.  Baltazar Apo, MD, PhD 06/18/2021, 3:53 PM [facility] [phone] or if no answer [phone]

## 2021-06-18 NOTE — Patient Instructions (Signed)
Please continue to keep your albuterol available to use 2 puffs to be needed for shortness of breath, chest tightness, wheezing.  Also pretreat exercise with 2 puffs about 10 minutes prior. Continue loratadine 10 mg once daily.  We will refill this for you today. Get your COVID-19 booster shot this fall Follow with Dr. Lamonte Sakai in 12 months or sooner if you have any problems.

## 2021-08-04 DIAGNOSIS — Z86010 Personal history of colonic polyps: Secondary | ICD-10-CM | POA: Diagnosis not present

## 2021-08-04 DIAGNOSIS — D123 Benign neoplasm of transverse colon: Secondary | ICD-10-CM | POA: Diagnosis not present

## 2021-08-04 DIAGNOSIS — D124 Benign neoplasm of descending colon: Secondary | ICD-10-CM | POA: Diagnosis not present

## 2021-08-04 DIAGNOSIS — K5730 Diverticulosis of large intestine without perforation or abscess without bleeding: Secondary | ICD-10-CM | POA: Diagnosis not present

## 2021-08-10 DIAGNOSIS — D123 Benign neoplasm of transverse colon: Secondary | ICD-10-CM | POA: Diagnosis not present

## 2021-08-10 DIAGNOSIS — D124 Benign neoplasm of descending colon: Secondary | ICD-10-CM | POA: Diagnosis not present

## 2021-08-31 DIAGNOSIS — Z961 Presence of intraocular lens: Secondary | ICD-10-CM | POA: Diagnosis not present

## 2021-08-31 DIAGNOSIS — H401133 Primary open-angle glaucoma, bilateral, severe stage: Secondary | ICD-10-CM | POA: Diagnosis not present

## 2021-10-21 DIAGNOSIS — I10 Essential (primary) hypertension: Secondary | ICD-10-CM | POA: Diagnosis not present

## 2021-10-21 DIAGNOSIS — Z0000 Encounter for general adult medical examination without abnormal findings: Secondary | ICD-10-CM | POA: Diagnosis not present

## 2021-10-21 DIAGNOSIS — E7800 Pure hypercholesterolemia, unspecified: Secondary | ICD-10-CM | POA: Diagnosis not present

## 2021-10-21 DIAGNOSIS — Z85820 Personal history of malignant melanoma of skin: Secondary | ICD-10-CM | POA: Diagnosis not present

## 2021-10-21 DIAGNOSIS — R7303 Prediabetes: Secondary | ICD-10-CM | POA: Diagnosis not present

## 2021-10-21 DIAGNOSIS — Z1389 Encounter for screening for other disorder: Secondary | ICD-10-CM | POA: Diagnosis not present

## 2021-10-21 DIAGNOSIS — Z85828 Personal history of other malignant neoplasm of skin: Secondary | ICD-10-CM | POA: Diagnosis not present

## 2021-10-21 DIAGNOSIS — H409 Unspecified glaucoma: Secondary | ICD-10-CM | POA: Diagnosis not present

## 2021-10-21 DIAGNOSIS — R059 Cough, unspecified: Secondary | ICD-10-CM | POA: Diagnosis not present

## 2021-10-28 ENCOUNTER — Ambulatory Visit: Payer: Medicare PPO | Admitting: Emergency Medicine

## 2021-10-28 ENCOUNTER — Encounter: Payer: Self-pay | Admitting: Emergency Medicine

## 2021-10-28 ENCOUNTER — Ambulatory Visit (INDEPENDENT_AMBULATORY_CARE_PROVIDER_SITE_OTHER): Payer: Medicare PPO

## 2021-10-28 ENCOUNTER — Other Ambulatory Visit: Payer: Self-pay

## 2021-10-28 VITALS — BP 154/82 | HR 60 | Temp 98.0°F | Ht 66.0 in | Wt 168.0 lb

## 2021-10-28 DIAGNOSIS — J4520 Mild intermittent asthma, uncomplicated: Secondary | ICD-10-CM

## 2021-10-28 DIAGNOSIS — R0602 Shortness of breath: Secondary | ICD-10-CM

## 2021-10-28 MED ORDER — PREDNISONE 10 MG PO TABS: ORAL_TABLET | ORAL | 0 refills | Status: DC

## 2021-10-28 NOTE — Progress Notes (Signed)
? ?Subjective:  ? ? Patient ID: Spencer Lloyd, male    DOB: 1946-01-22, 76 y.o.   MRN: [id number] ? ?HPI ? ?ROV 06/04/20 --follow-up visit for 76 year old gentleman, never smoker with hypertension, GERD, glaucoma, mild intermittent asthma.  He has experienced chronic cough and this prompted him to reestablish care at the end of August..  Last time we started loratadine and fluticasone nasal spray because he was having significant nasal drainage.  He was on omeprazole and we continued this.  I also tried him on a short course of prednisone to see if this would give him any benefit.  He reports today that he has had some acid reflux that seemed to paradoxically be worse on omeprazole. He did the fluticasone for about 12 days, stopped it because his opthalmologist was worried about his glaucoma. He remains on loratadine. Still has cough but less than before. But not gone. He uses albuterol several times a week, usually w strenuous activity.  ? ?Pulmonary function testing done today reviewed by me, show mild obstruction without a bronchodilator response, FEV1 2.23 L (84% predicted).  Lung volumes and diffusion capacity were both normal.  There was curve of his flow volume loop consistent with obstruction. ?CXR 04/22/20 clear  ? ?ROV 06/18/21 --76 year old man, never smoker whom I have seen for mild intermittent asthma and chronic cough.  Believe that this has been related to significant postnasal drip and drainage, probably also a component of GERD. ?Today he describes that he has been doing well. No cough. His nasal drainage is well controlled.  ?He has albuterol available to use if needed, uses rarely. Sometimes pretreats exercise. No flares. He did have COVID in July 2022.  ?He is on loratadine 10 mg daily ?Planning for the COVID booster this fall.  ? ? ?ROV 10/28/21 --Spencer Lloyd is 81, never smoker with a history of chronic cough and mild intermittent asthma impacted by chronic rhinitis, GERD.  Pulmonary function  testing 05/2020 confirmed mild obstruction without a bronchodilator response and curve of his flow-volume loop all consistent with mild asthma. ?He was doing well last visit, but today he reports that he has had SOB that began to be problematic since December, has started to notice wheeze especially at night. Has had dyspnea w moderate exercise, is SOB when he gets up in the am, has coughing and "gasping" when he gets up in the am. He is awakened from sleep with wheeze. Minimal nasal congestion, minimal sputum. He is using loratadine, nasal saline, pepcid. No real benefit from albuterol.  ?No new exposures. His valsartan was increased in Summer 2022 ? ? ?Review of Systems ?As per HPI ? ?   ?Objective:  ? Physical Exam ?Vitals:  ? 10/28/21 1132  ?BP: (!) 154/82  ?Pulse: 60  ?Temp: 98 ?F (36.7 ?C)  ?TempSrc: Oral  ?SpO2: 97%  ?Weight: 168 lb (76.2 kg)  ?Height: '5\' 6"'$  (1.676 m)  ? ?Gen: Pleasant, well-nourished, in no distress,  normal affect ? ?ENT: No lesions,  mouth clear,  oropharynx clear, no postnasal drip ? ?Neck: No JVD, UA noise on exp esp on a forced exp ? ?Lungs: No use of accessory muscles, soft end expiratory wheeze bilaterally, sounds like UA noise ? ?Cardiovascular: RRR, heart sounds normal, no murmur or gallops, no peripheral edema ? ?Musculoskeletal: No deformities, no cyanosis or clubbing ? ?Neuro: alert, awake, non focal ? ?Skin: Warm, no lesions or rash ? ?   ?Assessment & Plan:  ?Mild intermittent asthma ?Increased symptoms  for the last several months, since December.  Complaint of dyspnea, wheeze that happens at night, increased cough.  His wheeze sounds like upper airway noise on exam and he notes that it feels like it is upper airway when it is happening at home.  Suspect that GERD is a contributor, just started back on famotidine.  He had a paradoxical response to a PPI in the past and does not want to retry this.  He denies any significant increase in congestion, drainage on his current  allergy regimen, principally loratadine plus some nasal saline.  No wheeze on exam today reassuring PFT 05/2020 with mild obstruction and no reversibility.  I will check a chest x-ray today given the subacute change in his breathing.  Try to treat him for acute upper airway irritation with a course of prednisone.  Try to treat GERD and rhinitis as aggressively as possible.  If he continues to have symptoms that we do not believe can be ascribed to upper airway irritation and then question whether he may need cardiac evaluation. ? ?Take prednisone as directed until completely gone ?Continue your loratadine and your nasal saline spray as you have been doing ?Temporarily increase your famotidine to 20 mg twice a day for 1 week and then go back to 20 mg once daily ?Chest x-ray today ?Follow with APP in 2 weeks to assess your symptoms ?Follow Dr. Lamonte Sakai in 3 months or sooner if you have any problems. ? ?Baltazar Apo, MD, PhD ?10/28/2021, 1:38 PM ? [facility] ?[phone] or if no answer [phone] ? ?

## 2021-10-28 NOTE — Assessment & Plan Note (Signed)
Increased symptoms for the last several months, since December.  Complaint of dyspnea, wheeze that happens at night, increased cough.  His wheeze sounds like upper airway noise on exam and he notes that it feels like it is upper airway when it is happening at home.  Suspect that GERD is a contributor, just started back on famotidine.  He had a paradoxical response to a PPI in the past and does not want to retry this.  He denies any significant increase in congestion, drainage on his current allergy regimen, principally loratadine plus some nasal saline.  No wheeze on exam today reassuring PFT 05/2020 with mild obstruction and no reversibility.  I will check a chest x-ray today given the subacute change in his breathing.  Try to treat him for acute upper airway irritation with a course of prednisone.  Try to treat GERD and rhinitis as aggressively as possible.  If he continues to have symptoms that we do not believe can be ascribed to upper airway irritation and then question whether he may need cardiac evaluation. ? ?Take prednisone as directed until completely gone ?Continue your loratadine and your nasal saline spray as you have been doing ?Temporarily increase your famotidine to 20 mg twice a day for 1 week and then go back to 20 mg once daily ?Chest x-ray today ?Follow with APP in 2 weeks to assess your symptoms ?Follow Dr. Lamonte Sakai in 3 months or sooner if you have any problems. ?

## 2021-10-28 NOTE — Patient Instructions (Signed)
Take prednisone as directed until completely gone ?Continue your loratadine and your nasal saline spray as you have been doing ?Temporarily increase your famotidine to 20 mg twice a day for 1 week and then go back to 20 mg once daily ?Chest x-ray today ?Follow with APP in 2 weeks to assess your symptoms ?Follow Dr. Lamonte Sakai in 3 months or sooner if you have any problems. ?

## 2021-11-11 ENCOUNTER — Other Ambulatory Visit: Payer: Self-pay

## 2021-11-11 ENCOUNTER — Encounter: Payer: Self-pay | Admitting: Nurse Practitioner

## 2021-11-11 ENCOUNTER — Ambulatory Visit: Payer: Medicare PPO | Admitting: Nurse Practitioner

## 2021-11-11 DIAGNOSIS — R058 Other specified cough: Secondary | ICD-10-CM | POA: Diagnosis not present

## 2021-11-11 DIAGNOSIS — J4520 Mild intermittent asthma, uncomplicated: Secondary | ICD-10-CM

## 2021-11-11 DIAGNOSIS — R053 Chronic cough: Secondary | ICD-10-CM

## 2021-11-11 NOTE — Progress Notes (Signed)
? ?'@Patient'$  ID: Spencer Lloyd, male    DOB: 09-Jan-1946, 76 y.o.   MRN: [id number] ? ?Chief Complaint  ?Patient presents with  ? Follow-up  ?  He feels that he is doing better since last OV.   ? ? ?Referring provider: ?Gaynelle Arabian, MD ? ?HPI: ?76 year old male, never smoker followed for asthma and upper airway cough syndrome. He is a patient of Dr. Agustina Caroli and last seen in office on 10/28/2021. Past medical history significant for HTN, GERD, BPH, HLD, prediabetes.  ? ?TEST/EVENTS:  ?06/04/2020 PFTs: FVC 3.5 (95), FEV1 2.23 (84), ratio 62, TLC 91%, DLCOcor 103%. No BD. Mild obstructive airway disease without reversibility and normal diffusion capacity  ?10/28/2021 CXR 2 view: both lungs were clear without any evidence of acute process.  ? ?10/28/2021: OV with Dr. Lamonte Sakai. Worsening DOE, wheeze and cough over past several months. Started back on pepcid; paradoxical response to PPI in past so did not want to retry. Felt allergic rhinitis well-controlled on current allergy regimen. CXR without acute process and no evidence of significant bronchospasm on exam. Tx for upper airway irritaiton with pred taper  ? ?11/11/2021: Today - follow up ?Patient presents today for follow up after being treated for suspect upper airway irritation and related cough as well as possible mild asthma flare. He completed his prednisone taper and increased his dose of Pepcid, as directed, and feels like he's much better. Overall, breathing is stable and he has not noticed any wheezing. His cough is resolved. He has not required his rescue inhaler. He continues on pepcid for GERD control and Claritin for allergies. He does not that he feels like his symptoms get worse with spring time and working in the yard, which he hasn't started doing yet. We discussed him monitoring his symptoms and notifying if he starts to feel worse again.  ? ?No Known Allergies ? ?Immunization History  ?Administered Date(s) Administered  ? Moderna Sars-Covid-2 Vaccination  10/07/2019, 10/24/2019, 11/05/2019, 06/22/2020  ? Pneumococcal Conjugate-13 10/22/2014  ? Pneumococcal Polysaccharide-23 03/24/2012  ? Td 03/23/2005  ? Tdap 09/03/2011, 06/15/2016  ? ? ?Past Medical History:  ?Diagnosis Date  ? Benign localized prostatic hyperplasia with lower urinary tract symptoms (LUTS)   ? Diverticulosis   ? GERD (gastroesophageal reflux disease)   ? watch diet  ? Glaucoma, both eyes   ? History of adenomatous polyp of colon   ? History of syncope   ? per pt has hx of syncopal episodes due to orthostatic hypotension,  last one 04/ 2019 and before that 2017  ? Hypercholesteremia   ? Hypertension   ? Mild intermittent asthma   ? Personal history of malignant melanoma 1998  ? s/p  wide local excision from mid lower back ,  per pt sln's negative and no recurrence  ? Prediabetes   ? Wears glasses   ? ? ?Tobacco History: ?Social History  ? ?Tobacco Use  ?Smoking Status Never  ?Smokeless Tobacco Never  ? ?Counseling given: Not Answered ? ? ?Outpatient Medications Prior to Visit  ?Medication Sig Dispense Refill  ? albuterol (VENTOLIN HFA) 108 (90 Base) MCG/ACT inhaler INHALE 2 PUFFS BY MOUTH EVERY 4 HOURS ASNEEDED FOR WHEEZING OR SHORTNESS OF BREATH 8.5 g 2  ? ALLERGY RELIEF 10 MG tablet TAKE 1 TABLET BY MOUTH DAILY 30 tablet 0  ? ALPHAGAN P 0.1 % SOLN Place 1 drop into both eyes 2 (two) times daily.     ? amLODipine (NORVASC) 5 MG tablet Take 5  mg by mouth at bedtime.     ? dorzolamide-timolol (COSOPT) 22.3-6.8 MG/ML ophthalmic solution Place 1 drop into both eyes 2 (two) times daily.     ? famotidine (PEPCID) 20 MG tablet 1 tablet    ? latanoprost (XALATAN) 0.005 % ophthalmic solution     ? loratadine (CLARITIN) 10 MG tablet Take 1 tablet (10 mg total) by mouth daily. 90 tablet 3  ? meloxicam (MOBIC) 7.5 MG tablet Take 7.5 mg by mouth daily.    ? oxybutynin (DITROPAN-XL) 10 MG 24 hr tablet Take 10 mg by mouth as needed.    ? pravastatin (PRAVACHOL) 40 MG tablet Take 40 mg by mouth daily.    ?  valsartan (DIOVAN) 80 MG tablet Take 80 mg by mouth daily.    ? predniSONE (DELTASONE) 10 MG tablet Take 4 tablets X 3 days, 3 tabs X 3 days, 2 tabs x 3 days, 1 tab x 3 days (Patient not taking: Reported on 11/11/2021) 30 tablet 0  ? ?No facility-administered medications prior to visit.  ? ? ? ?Review of Systems:  ? ?Constitutional: No weight loss or gain, night sweats, fevers, chills, fatigue, or lassitude. ?HEENT: No headaches, difficulty swallowing, tooth/dental problems, or sore throat. No sneezing, itching, ear ache, nasal congestion, or post nasal drip ?CV:  No chest pain, orthopnea, PND, swelling in lower extremities, anasarca, dizziness, palpitations, syncope ?Resp: No shortness of breath with exertion or at rest. No excess mucus or change in color of mucus. No productive or non-productive. No hemoptysis. No wheezing.  No chest wall deformity ?Skin: No rash, lesions, ulcerations ?Neuro: No dizziness or lightheadedness.  ?Psych: No depression or anxiety. Mood stable.  ? ? ? ?Physical Exam: ? ?BP 120/74 (BP Location: Left Arm, Patient Position: Sitting, Cuff Size: Normal)   Pulse (!) 59   Temp 98.4 ?F (36.9 ?C) (Oral)   Ht '5\' 6"'$  (1.676 m)   Wt 168 lb (76.2 kg)   SpO2 98%   BMI 27.12 kg/m?  ? ?GEN: Pleasant, interactive, well-appearing; in no acute distress. ?HEENT:  Normocephalic and atraumatic. PERRLA. Sclera white. Nasal turbinates pink, moist and patent bilaterally. No rhinorrhea present. Oropharynx pink and moist, without exudate or edema. No lesions, ulcerations, or postnasal drip.  ?NECK:  Supple w/ fair ROM. No JVD present. Normal carotid impulses w/o bruits. Thyroid symmetrical with no goiter or nodules palpated. No lymphadenopathy.   ?CV: RRR, no m/r/g, no peripheral edema. Pulses intact, +2 bilaterally. No cyanosis, pallor or clubbing. ?PULMONARY:  Unlabored, regular breathing. Clear bilaterally A&P w/o wheezes/rales/rhonchi. No accessory muscle use. No dullness to percussion. ?GI: BS present  and normoactive. Soft, non-tender to palpation.  ?Neuro: A/Ox3. No focal deficits noted.   ?Skin: Warm, no lesions or rashe ?Psych: Normal affect and behavior. Judgement and thought content appropriate.  ? ? ? ?Lab Results: ? ?CBC ?   ?Component Value Date/Time  ? WBC 7.0 06/30/2018 0957  ? RBC 5.51 06/30/2018 0957  ? HGB 16.6 06/30/2018 0957  ? HCT 50.5 06/30/2018 0957  ? PLT 202 06/30/2018 0957  ? MCV 91.7 06/30/2018 0957  ? MCH 30.1 06/30/2018 0957  ? MCHC 32.9 06/30/2018 0957  ? RDW 13.2 06/30/2018 0957  ? ? ?BMET ?   ?Component Value Date/Time  ? NA 142 06/30/2018 0957  ? K 4.6 06/30/2018 0957  ? CL 111 06/30/2018 0957  ? CO2 23 06/30/2018 0957  ? GLUCOSE 109 (H) 06/30/2018 0957  ? BUN 17 06/30/2018 0957  ? CREATININE 1.23 06/30/2018  3570  ? CALCIUM 9.5 06/30/2018 0957  ? GFRNONAA 57 (L) 06/30/2018 0957  ? GFRAA >60 06/30/2018 0957  ? ? ?BNP ?No results found for: BNP ? ? ?Imaging: ? ?DG Chest 2 View ? ?Result Date: 10/28/2021 ?CLINICAL DATA:  Shortness of breath. EXAM: CHEST - 2 VIEW COMPARISON:  April 21, 2020 FINDINGS: The heart size and mediastinal contours are within normal limits. Both lungs are clear. The visualized skeletal structures are unremarkable. IMPRESSION: No active cardiopulmonary disease. Electronically Signed   By: Lovey Newcomer M.D.   On: 10/28/2021 22:02   ? ? ? ? ?  Latest Ref Rng & Units 06/04/2020  ? 10:51 AM 03/01/2017  ?  2:47 PM  ?PFT Results  ?FVC-Pre L 3.40   3.50    ?FVC-Predicted Pre % 92   89    ?FVC-Post L 3.50   3.70    ?FVC-Predicted Post % 95   94    ?Pre FEV1/FVC % % 66   69    ?Post FEV1/FCV % % 62   73    ?FEV1-Pre L 2.23   2.40    ?FEV1-Predicted Pre % 84   83    ?FEV1-Post L 2.18   2.71    ?DLCO uncorrected ml/min/mmHg 23.20   22.96    ?DLCO UNC% % 103   81    ?DLCO corrected ml/min/mmHg 23.20   22.77    ?DLCO COR %Predicted % 103   80    ?DLVA Predicted % 109   100    ?TLC L 5.68   5.48    ?TLC % Predicted % 91   85    ?RV % Predicted % 100   80    ? ? ?No results found  for: NITRICOXIDE ? ? ? ? ? ?Assessment & Plan:  ? ?Mild intermittent asthma ?Possible exacerbation resolved. Breathing stable at visit. Discussed trigger prevention going into spring. Continue PRN albuterol. If symptom

## 2021-11-11 NOTE — Patient Instructions (Addendum)
Continue Albuterol inhaler 2 puffs every 6 hours as needed for shortness of breath or wheezing. Notify if symptoms persist despite rescue inhaler/neb use. ?Continue Claritin (loratidine) 10 mg daily for allergies ?Continue Pepcid (famotidine) 20 mg daily  ? ?Follow up with Dr. Lamonte Sakai in 3 months. If symptoms do not improve or worsen, please contact office for sooner follow up or seek emergency care. ?

## 2021-11-11 NOTE — Assessment & Plan Note (Signed)
Improvement with GERD prevention and postnasal drainage control. Trigger prevention discussed.  ?

## 2021-11-11 NOTE — Assessment & Plan Note (Signed)
Possible exacerbation resolved. Breathing stable at visit. Discussed trigger prevention going into spring. Continue PRN albuterol. If symptoms flare again, will obtain FeNO and CBC with diff.  ? ?Patient Instructions  ?Continue Albuterol inhaler 2 puffs every 6 hours as needed for shortness of breath or wheezing. Notify if symptoms persist despite rescue inhaler/neb use. ?Continue Claritin (loratidine) 10 mg daily for allergies ?Continue Pepcid (famotidine) 20 mg daily  ? ?Follow up with Dr. Lamonte Sakai in 3 months. If symptoms do not improve or worsen, please contact office for sooner follow up or seek emergency care. ? ? ?

## 2021-12-17 DIAGNOSIS — C44319 Basal cell carcinoma of skin of other parts of face: Secondary | ICD-10-CM | POA: Diagnosis not present

## 2021-12-17 DIAGNOSIS — L814 Other melanin hyperpigmentation: Secondary | ICD-10-CM | POA: Diagnosis not present

## 2021-12-17 DIAGNOSIS — L821 Other seborrheic keratosis: Secondary | ICD-10-CM | POA: Diagnosis not present

## 2021-12-17 DIAGNOSIS — D485 Neoplasm of uncertain behavior of skin: Secondary | ICD-10-CM | POA: Diagnosis not present

## 2021-12-17 DIAGNOSIS — Z85828 Personal history of other malignant neoplasm of skin: Secondary | ICD-10-CM | POA: Diagnosis not present

## 2021-12-17 DIAGNOSIS — L570 Actinic keratosis: Secondary | ICD-10-CM | POA: Diagnosis not present

## 2021-12-17 DIAGNOSIS — Z85820 Personal history of malignant melanoma of skin: Secondary | ICD-10-CM | POA: Diagnosis not present

## 2021-12-22 ENCOUNTER — Telehealth: Payer: Self-pay | Admitting: Emergency Medicine

## 2021-12-22 NOTE — Telephone Encounter (Signed)
I believe he needs to be seen to work up further, either by [facility] or [place] ?

## 2021-12-22 NOTE — Telephone Encounter (Signed)
Called and spoke with pt and have scheduled him for an appt with [facility] 5/3. Nothing further needed. ?

## 2021-12-22 NOTE — Telephone Encounter (Signed)
Called and spoke with patient. He stated that he was given a prednisone taper back on 10/22/21 for the increased wheezing and SOB he had at the time. He felt well up until the middle of April and his symptoms have been slowing coming back. Increase wheezing, especially at night. Denied having a cough, fever or sore throat. Also denied being around anyone who has been sick recently. ? ?He did confirm that the only inhaler he has is albuterol and it seems like it is not working like it has in the past. He is using it on average 3-4 times a day.  ? ?He wanted to know if he could have another prednisone taper to get rid of the symptoms. Pharmacy is [facility].  ? ?RB, can you please advise? Thanks!  ?

## 2021-12-23 ENCOUNTER — Encounter: Payer: Self-pay | Admitting: Nurse Practitioner

## 2021-12-23 ENCOUNTER — Ambulatory Visit: Payer: Medicare PPO | Admitting: Nurse Practitioner

## 2021-12-23 VITALS — BP 136/66 | HR 64 | Temp 98.3°F | Ht 67.0 in | Wt 167.2 lb

## 2021-12-23 DIAGNOSIS — J4541 Moderate persistent asthma with (acute) exacerbation: Secondary | ICD-10-CM

## 2021-12-23 DIAGNOSIS — J302 Other seasonal allergic rhinitis: Secondary | ICD-10-CM

## 2021-12-23 DIAGNOSIS — K219 Gastro-esophageal reflux disease without esophagitis: Secondary | ICD-10-CM

## 2021-12-23 LAB — CBC WITH DIFFERENTIAL/PLATELET
Basophils Absolute: 0.1 10*3/uL (ref 0.0–0.1)
Basophils Relative: 0.8 % (ref 0.0–3.0)
Eosinophils Absolute: 0.4 10*3/uL (ref 0.0–0.7)
Eosinophils Relative: 6.3 % — ABNORMAL HIGH (ref 0.0–5.0)
HCT: 45.4 % (ref 39.0–52.0)
Hemoglobin: 15.3 g/dL (ref 13.0–17.0)
Lymphocytes Relative: 22.7 % (ref 12.0–46.0)
Lymphs Abs: 1.4 10*3/uL (ref 0.7–4.0)
MCHC: 33.7 g/dL (ref 30.0–36.0)
MCV: 89 fl (ref 78.0–100.0)
Monocytes Absolute: 0.6 10*3/uL (ref 0.1–1.0)
Monocytes Relative: 9.9 % (ref 3.0–12.0)
Neutro Abs: 3.8 10*3/uL (ref 1.4–7.7)
Neutrophils Relative %: 60.3 % (ref 43.0–77.0)
Platelets: 208 10*3/uL (ref 150.0–400.0)
RBC: 5.1 Mil/uL (ref 4.22–5.81)
RDW: 13.4 % (ref 11.5–15.5)
WBC: 6.3 10*3/uL (ref 4.0–10.5)

## 2021-12-23 LAB — POCT EXHALED NITRIC OXIDE: FeNO level (ppb): 47

## 2021-12-23 MED ORDER — PREDNISONE 10 MG PO TABS: ORAL_TABLET | ORAL | 0 refills | Status: DC

## 2021-12-23 MED ORDER — BUDESONIDE-FORMOTEROL FUMARATE 80-4.5 MCG/ACT IN AERO: 2.0000 | INHALATION_SPRAY | Freq: Two times a day (BID) | RESPIRATORY_TRACT | 5 refills | Status: DC

## 2021-12-23 MED ORDER — METHYLPREDNISOLONE ACETATE 80 MG/ML IJ SUSP
80.0000 mg | Freq: Once | INTRAMUSCULAR | Status: AC
  Administered 2021-12-23: 80 mg via INTRAMUSCULAR

## 2021-12-23 NOTE — Patient Instructions (Addendum)
Continue Albuterol inhaler 2 puffs every 6 hours as needed for shortness of breath or wheezing. Notify if symptoms persist despite rescue inhaler/neb use. ?Continue Claritin (loratidine) 10 mg daily for allergies ?Continue Pepcid (famotidine) 20 mg daily  ? ?-Start Symbicort 2 puffs Twice daily. Brush tongue and rinse mouth afterwards. This is your maintenance inhaler that you will use daily, regardless of your symptoms ?-Prednisone taper. 4 tabs for 2 days, then 3 tabs for 2 days, 2 tabs for 2 days, then 1 tab for 2 days, then stop. Take in AM with food.Start tomorrow  ?-Delsym 2 tsp Twice daily for cough ?-Chlorpheniramine 4 mg tab At bedtime as needed for cough  ? ?Labs today - IgE, CBC with diff ?  ?Follow up with Spencer Lloyd or Spencer Jersey Fredricka Kohrs,NP in 1 month. If symptoms do not improve or worsen, please contact office for sooner follow up or seek emergency care. ?

## 2021-12-23 NOTE — Progress Notes (Signed)
? ?'@Patient'$  ID: Spencer Lloyd, male    DOB: 1945-11-06, 76 y.o.   MRN: [id number] ? ?Chief Complaint  ?Patient presents with  ? Acute Visit  ?  Pt states that he is having some increased SOB, gasping for breath, wheezing and coughing. In the past he was on a taper pack of prednisone from Dr Lamonte Sakai when these symptoms started before and it did help with his symptoms..Pt states these symptoms started back on April 12.   ? ? ?Referring provider: ?Gaynelle Arabian, MD ? ?HPI: ?76 year old male, never smoker followed for asthma and upper airway cough syndrome.  He is a patient Dr. Agustina Caroli and was last seen in office on 11/11/2021 by [facility] NP.  Past medical history significant for hypertension, GERD, BPH, HLD, prediabetes. ? ?TEST/EVENTS:  ?06/04/2020 PFTs: FVC 95, FEV1 84, ratio 62, TLC 91, DLCOcor 103%.  No BD.  Mild obstructive airway disease without reversibility and normal diffusion capacity. ?10/28/2021 CXR 2 view: Both lungs were clear without any evidence of acute process ? ?10/28/2021: OV with Dr. Lamonte Sakai.  Worsening DOE, wheeze and cough over the past several months.  Started back on Pepcid; paradoxical response to PPI in past and did not want to retry.  Felt allergic rhinitis well-controlled on current allergy regimen.  CXR without acute process no evidence of significant bronchospasm on exam.  Treated for upper irritation with pred taper. ? ?11/11/2021: OV with Lenore Moyano NP for follow-up.  Reported feeling much better.  Breathing was stable and had not noticed any further wheezing.  Cough was resolved.  He was concerned going into spring that his symptoms might flareup again as he feels like his symptoms always get worse with springtime and working in the yard.  Advised to monitor his symptoms and notify us if worsening occurs.  Continue on as needed albuterol, Claritin and Pepcid. ? ?12/23/2021: Today-acute visit ?Patient presents today for concern for asthma flare.  He has had some increased shortness of breath, wheezing  and coughing over the last few weeks.  Shortness of breath is primarily upon exertion.  Cough is primarily dry and paroxysmal at times; usually happens when he starts becoming short of breath.  He has been having to use his rescue inhaler multiple times a day.  Has been waking up frequently throughout the week with wheezing and increased shortness of breath as well.  He feels like his allergy symptoms are well-controlled on current regimen. No breakthrough GERD symptoms. Denies recent sick exposures, fevers, hemoptysis, weight loss, anorexia, or leg swelling.  ? ?ACT 9 ?FENO 43 ppb ? ?No Known Allergies ? ?Immunization History  ?Administered Date(s) Administered  ? Moderna Sars-Covid-2 Vaccination 10/07/2019, 10/24/2019, 11/05/2019, 06/22/2020  ? Pneumococcal Conjugate-13 10/22/2014  ? Pneumococcal Polysaccharide-23 03/24/2012  ? Td 03/23/2005  ? Tdap 09/03/2011, 06/15/2016  ? ? ?Past Medical History:  ?Diagnosis Date  ? Benign localized prostatic hyperplasia with lower urinary tract symptoms (LUTS)   ? Diverticulosis   ? GERD (gastroesophageal reflux disease)   ? watch diet  ? Glaucoma, both eyes   ? History of adenomatous polyp of colon   ? History of syncope   ? per pt has hx of syncopal episodes due to orthostatic hypotension,  last one 04/ 2019 and before that 2017  ? Hypercholesteremia   ? Hypertension   ? Mild intermittent asthma   ? Personal history of malignant melanoma 1998  ? s/p  wide local excision from mid lower back ,  per pt sln's negative and no  recurrence  ? Prediabetes   ? Wears glasses   ? ? ?Tobacco History: ?Social History  ? ?Tobacco Use  ?Smoking Status Never  ?Smokeless Tobacco Never  ? ?Counseling given: Not Answered ? ? ?Outpatient Medications Prior to Visit  ?Medication Sig Dispense Refill  ? albuterol (VENTOLIN HFA) 108 (90 Base) MCG/ACT inhaler INHALE 2 PUFFS BY MOUTH EVERY 4 HOURS ASNEEDED FOR WHEEZING OR SHORTNESS OF BREATH 8.5 g 2  ? ALLERGY RELIEF 10 MG tablet TAKE 1 TABLET BY MOUTH  DAILY 30 tablet 0  ? ALPHAGAN P 0.1 % SOLN Place 1 drop into both eyes 2 (two) times daily.     ? amLODipine (NORVASC) 5 MG tablet Take 5 mg by mouth at bedtime.     ? dorzolamide-timolol (COSOPT) 22.3-6.8 MG/ML ophthalmic solution Place 1 drop into both eyes 2 (two) times daily.     ? famotidine (PEPCID) 20 MG tablet 1 tablet    ? latanoprost (XALATAN) 0.005 % ophthalmic solution     ? loratadine (CLARITIN) 10 MG tablet Take 1 tablet (10 mg total) by mouth daily. 90 tablet 3  ? oxybutynin (DITROPAN-XL) 10 MG 24 hr tablet Take 10 mg by mouth as needed.    ? pravastatin (PRAVACHOL) 40 MG tablet Take 40 mg by mouth daily.    ? valsartan (DIOVAN) 80 MG tablet Take 80 mg by mouth daily.    ? meloxicam (MOBIC) 7.5 MG tablet Take 7.5 mg by mouth daily.    ? ?No facility-administered medications prior to visit.  ? ? ? ?Review of Systems:  ? ?Constitutional: No weight loss or gain, night sweats, fevers, chills, fatigue, or lassitude. ?HEENT: No headaches, difficulty swallowing, tooth/dental problems, or sore throat. No sneezing, itching, ear ache, nasal congestion, or post nasal drip ?CV:  +PND. No chest pain, orthopnea, swelling in lower extremities, anasarca, dizziness, palpitations, syncope ?Resp: +shortness of breath with exertion; dry, paroxysmal cough; wheezing. No excess mucus or change in color of mucus. No hemoptysis. No chest wall deformity ?GI:  No heartburn, indigestion, abdominal pain, nausea, vomiting, diarrhea, change in bowel habits, loss of appetite, bloody stools.  ?Skin: No rash, lesions, ulcerations ?MSK:  No joint pain or swelling.  No decreased range of motion.  No back pain. ?Neuro: No dizziness or lightheadedness.  ?Psych: No depression or anxiety. Mood stable.  ? ? ? ?Physical Exam: ? ?BP 136/66 (BP Location: Right Arm, Patient Position: Sitting, Cuff Size: Normal)   Pulse 64   Temp 98.3 ?F (36.8 ?C) (Oral)   Ht '5\' 7"'$  (1.702 m)   Wt 167 lb 3.2 oz (75.8 kg)   SpO2 97%   BMI 26.19 kg/m?  ? ?GEN:  Pleasant, interactive, well-appearing; in no acute distress. ?HEENT:  Normocephalic and atraumatic. PERRLA. Sclera white. Nasal turbinates pink, moist and patent bilaterally. No rhinorrhea present. Oropharynx pink and moist, without exudate or edema. No lesions, ulcerations, or postnasal drip.  ?NECK:  Supple w/ fair ROM. No JVD present. Normal carotid impulses w/o bruits. Thyroid symmetrical with no goiter or nodules palpated. No lymphadenopathy.   ?CV: RRR, no m/r/g, no peripheral edema. Pulses intact, +2 bilaterally. No cyanosis, pallor or clubbing. ?PULMONARY:  Unlabored, regular breathing. Scattered expiratory wheezes bilaterally A&P. No accessory muscle use. No dullness to percussion. ?GI: BS present and normoactive. Soft, non-tender to palpation. No organomegaly or masses detected. No CVA tenderness. ?MSK: No erythema, warmth or tenderness. Cap refil <2 sec all extrem. No deformities or joint swelling noted.  ?Neuro: A/Ox3. No focal  deficits noted.   ?Skin: Warm, no lesions or rashe ?Psych: Normal affect and behavior. Judgement and thought content appropriate.  ? ? ? ?Lab Results: ? ?CBC ?   ?Component Value Date/Time  ? WBC 7.0 06/30/2018 0957  ? RBC 5.51 06/30/2018 0957  ? HGB 16.6 06/30/2018 0957  ? HCT 50.5 06/30/2018 0957  ? PLT 202 06/30/2018 0957  ? MCV 91.7 06/30/2018 0957  ? MCH 30.1 06/30/2018 0957  ? MCHC 32.9 06/30/2018 0957  ? RDW 13.2 06/30/2018 0957  ? ? ?BMET ?   ?Component Value Date/Time  ? NA 142 06/30/2018 0957  ? K 4.6 06/30/2018 0957  ? CL 111 06/30/2018 0957  ? CO2 23 06/30/2018 0957  ? GLUCOSE 109 (H) 06/30/2018 0957  ? BUN 17 06/30/2018 0957  ? CREATININE 1.23 06/30/2018 0957  ? CALCIUM 9.5 06/30/2018 0957  ? GFRNONAA 57 (L) 06/30/2018 0957  ? GFRAA >60 06/30/2018 0957  ? ? ?BNP ?No results found for: BNP ? ? ?Imaging: ? ?No results found. ? ?methylPREDNISolone acetate (DEPO-MEDROL) injection 80 mg   ? ? Date Action Dose Route User  ? 12/23/2021 1000 Given 80 mg Intramuscular (Left  Ventrogluteal) Monna Fam L, RN  ? ?  ? ? ? ?  Latest Ref Rng & Units 06/04/2020  ? 10:51 AM 03/01/2017  ?  2:47 PM  ?PFT Results  ?FVC-Pre L 3.40   3.50    ?FVC-Predicted Pre % 92   89    ?FVC-Post L 3.50   3

## 2021-12-23 NOTE — Assessment & Plan Note (Signed)
Previous paradoxical effect on PPI. Well-controlled on pepcid currently.  ?

## 2021-12-23 NOTE — Assessment & Plan Note (Signed)
Persistent symptoms over the past 3 weeks of increased DOE, wheezing, and reactive cough. FeNO elevated at OV today with bronchospasm evident on exam. Depo 80 mg inj x1. Prednisone taper. Step up to ICS/LABA therapy with Symbicort. Advised that if he is well controlled for minimum of 3 months, we can discuss using on an as needed basis during summer and fall. Would advise he use it daily during allergy season as this is when he tends to flare. CBC with diff and IgE to assess for allergen component.  ? ?Patient Instructions  ?Continue Albuterol inhaler 2 puffs every 6 hours as needed for shortness of breath or wheezing. Notify if symptoms persist despite rescue inhaler/neb use. ?Continue Claritin (loratidine) 10 mg daily for allergies ?Continue Pepcid (famotidine) 20 mg daily  ? ?-Start Symbicort 2 puffs Twice daily. Brush tongue and rinse mouth afterwards. This is your maintenance inhaler that you will use daily, regardless of your symptoms ?-Prednisone taper. 4 tabs for 2 days, then 3 tabs for 2 days, 2 tabs for 2 days, then 1 tab for 2 days, then stop. Take in AM with food.Start tomorrow  ?-Delsym 2 tsp Twice daily for cough ?-Chlorpheniramine 4 mg tab At bedtime as needed for cough  ? ?Labs today - IgE, CBC with diff ?  ?Follow up with Dr. Lamonte Sakai or Joellen Jersey Joeph Szatkowski,NP in 1 month. If symptoms do not improve or worsen, please contact office for sooner follow up or seek emergency care. ? ? ?

## 2021-12-23 NOTE — Assessment & Plan Note (Signed)
Well-controlled on current regimen. ?

## 2021-12-24 LAB — IGE: IgE (Immunoglobulin E), Serum: 81 kU/L (ref <=114)

## 2022-01-04 DIAGNOSIS — H401133 Primary open-angle glaucoma, bilateral, severe stage: Secondary | ICD-10-CM | POA: Diagnosis not present

## 2022-01-04 DIAGNOSIS — Z961 Presence of intraocular lens: Secondary | ICD-10-CM | POA: Diagnosis not present

## 2022-02-03 ENCOUNTER — Encounter: Payer: Self-pay | Admitting: Emergency Medicine

## 2022-02-03 ENCOUNTER — Ambulatory Visit: Payer: Medicare PPO | Admitting: Emergency Medicine

## 2022-02-03 DIAGNOSIS — J4520 Mild intermittent asthma, uncomplicated: Secondary | ICD-10-CM

## 2022-02-03 DIAGNOSIS — K219 Gastro-esophageal reflux disease without esophagitis: Secondary | ICD-10-CM | POA: Diagnosis not present

## 2022-02-03 DIAGNOSIS — J302 Other seasonal allergic rhinitis: Secondary | ICD-10-CM | POA: Diagnosis not present

## 2022-02-03 NOTE — Assessment & Plan Note (Signed)
Continue Pepcid  

## 2022-02-03 NOTE — Assessment & Plan Note (Signed)
Continue loratadine.  He did not start chlorpheniramine or Delsym

## 2022-02-03 NOTE — Addendum Note (Signed)
Addended by: Gavin Potters R on: 02/03/2022 09:28 AM   Modules accepted: Orders

## 2022-02-03 NOTE — Progress Notes (Signed)
   Subjective:    Patient ID: Spencer Lloyd, male    DOB: 28-Feb-1946, 76 y.o.   MRN: [id number]  HPI  ROV 10/28/21 --Mr. Antenucci is 73, never smoker with a history of chronic cough and mild intermittent asthma impacted by chronic rhinitis, GERD.  Pulmonary function testing 05/2020 confirmed mild obstruction without a bronchodilator response and curve of his flow-volume loop all consistent with mild asthma. He was doing well last visit, but today he reports that he has had SOB that began to be problematic since December, has started to notice wheeze especially at night. Has had dyspnea w moderate exercise, is SOB when he gets up in the am, has coughing and "gasping" when he gets up in the am. He is awakened from sleep with wheeze. Minimal nasal congestion, minimal sputum. He is using loratadine, nasal saline, pepcid. No real benefit from albuterol.  No new exposures. His valsartan was increased in Summer 2022  ROV 02/03/22 --follow-up visit 76 year old gentleman with a history of mild intermittent asthma and chronic cough in the setting of chronic rhinitis and GERD.  He does have mild obstruction on PFT.  He was doing well but had an asthma flare in May, was treated with prednisone and started on Symbicort.  Unclear trigger beyond possibly allergic rhinitis.  The flare was characterized by wheeze, nocturnal awakenings, SOB. Today he reports that he quickly improved  He is managed on Symbicort, loratadine, Pepcid.  He is using albuterol rarely now. The albuterol does not seem to help much even when he is flaring.   12/23/21 >> IgE 81, eos% 6.3, abs 0.4  Review of Systems As per HPI     Objective:   Physical Exam Vitals:   02/03/22 0858  BP: (!) 142/78  Pulse: 61  Temp: 98.2 F (36.8 C)  TempSrc: Oral  SpO2: 99%  Weight: 166 lb 3.2 oz (75.4 kg)  Height: '5\' 7"'$  (1.702 m)   Gen: Pleasant, well-nourished, in no distress,  normal affect  ENT: No lesions,  mouth clear,  oropharynx clear, no  postnasal drip, gravelly voice  Neck: No JVD, no stridor  Lungs: No use of accessory muscles, no wheezing or referred noise  Cardiovascular: RRR, heart sounds normal, no murmur or gallops, no peripheral edema  Musculoskeletal: No deformities, no cyanosis or clubbing  Neuro: alert, awake, non focal  Skin: Warm, no lesions or rash     Assessment & Plan:  Mild intermittent asthma Recent flare in May.  His flares are often difficult to differentiate between upper airway and true bronchospasm.  It does sound like he had dyspnea, wheeze that would be consistent with a lower airways flare.  He benefited significantly from the prednisone.  He is still on the Symbicort, has hoarse voice but no stridor.  Unclear whether he is benefiting based on his report.  We will do a trial off and see if he tolerates, see if this helps his upper airway irritation.  Continue his treatment for rhinitis and GERD.  He has albuterol to use if needed.  He will call if he backtracks in any way or has flaring symptoms.  GERD (gastroesophageal reflux disease) Continue Pepcid  Seasonal allergies Continue loratadine.  He did not start chlorpheniramine or Delsym  Baltazar Apo, MD, PhD 02/03/2022, 9:21 AM [facility] [phone] or if no answer [phone]

## 2022-02-03 NOTE — Patient Instructions (Signed)
Stop Symbicort for now.  Depending on how symptoms evolve going forward we may decide to restart a maintenance inhaler Keep your albuterol available to use 2 puffs if needed for shortness of breath, chest tightness, wheezing. Continue your loratadine 10 mg once daily. Continue your Pepcid as you have been taking it. Keep your flu shot and your COVID-19 vaccine up-to-date Follow with APP in 6 months Follow Dr. Lamonte Sakai in 12 months or sooner if you have any problems.

## 2022-02-03 NOTE — Assessment & Plan Note (Signed)
Recent flare in May.  His flares are often difficult to differentiate between upper airway and true bronchospasm.  It does sound like he had dyspnea, wheeze that would be consistent with a lower airways flare.  He benefited significantly from the prednisone.  He is still on the Symbicort, has hoarse voice but no stridor.  Unclear whether he is benefiting based on his report.  We will do a trial off and see if he tolerates, see if this helps his upper airway irritation.  Continue his treatment for rhinitis and GERD.  He has albuterol to use if needed.  He will call if he backtracks in any way or has flaring symptoms.

## 2022-04-23 DIAGNOSIS — Z85820 Personal history of malignant melanoma of skin: Secondary | ICD-10-CM | POA: Diagnosis not present

## 2022-04-23 DIAGNOSIS — Z85828 Personal history of other malignant neoplasm of skin: Secondary | ICD-10-CM | POA: Diagnosis not present

## 2022-04-23 DIAGNOSIS — R7303 Prediabetes: Secondary | ICD-10-CM | POA: Diagnosis not present

## 2022-04-23 DIAGNOSIS — H409 Unspecified glaucoma: Secondary | ICD-10-CM | POA: Diagnosis not present

## 2022-04-23 DIAGNOSIS — K219 Gastro-esophageal reflux disease without esophagitis: Secondary | ICD-10-CM | POA: Diagnosis not present

## 2022-04-23 DIAGNOSIS — I10 Essential (primary) hypertension: Secondary | ICD-10-CM | POA: Diagnosis not present

## 2022-04-23 DIAGNOSIS — J4520 Mild intermittent asthma, uncomplicated: Secondary | ICD-10-CM | POA: Diagnosis not present

## 2022-04-23 DIAGNOSIS — E7800 Pure hypercholesterolemia, unspecified: Secondary | ICD-10-CM | POA: Diagnosis not present

## 2022-05-03 ENCOUNTER — Ambulatory Visit: Payer: Medicare PPO | Admitting: Adult Health

## 2022-05-03 ENCOUNTER — Encounter: Payer: Self-pay | Admitting: Adult Health

## 2022-05-03 DIAGNOSIS — J4541 Moderate persistent asthma with (acute) exacerbation: Secondary | ICD-10-CM | POA: Diagnosis not present

## 2022-05-03 DIAGNOSIS — J302 Other seasonal allergic rhinitis: Secondary | ICD-10-CM

## 2022-05-03 DIAGNOSIS — J45901 Unspecified asthma with (acute) exacerbation: Secondary | ICD-10-CM | POA: Insufficient documentation

## 2022-05-03 MED ORDER — ALBUTEROL SULFATE (2.5 MG/3ML) 0.083% IN NEBU
2.5000 mg | INHALATION_SOLUTION | Freq: Once | RESPIRATORY_TRACT | Status: AC
  Administered 2022-05-03: 2.5 mg via RESPIRATORY_TRACT

## 2022-05-03 MED ORDER — PREDNISONE 10 MG PO TABS: ORAL_TABLET | ORAL | 0 refills | Status: DC

## 2022-05-03 MED ORDER — MONTELUKAST SODIUM 10 MG PO TABS: 10.0000 mg | ORAL_TABLET | Freq: Every day | ORAL | 5 refills | Status: DC

## 2022-05-03 MED ORDER — BENZONATATE 200 MG PO CAPS: 200.0000 mg | ORAL_CAPSULE | Freq: Three times a day (TID) | ORAL | 1 refills | Status: DC | PRN

## 2022-05-03 NOTE — Patient Instructions (Addendum)
Prednisone taper over next week.  Restart Symbicort 2 puffs Twice daily , add spacer , rinse after use.  Albuterol inhaler As needed   Begin Singulair '10mg'$  daily  Change Claritin to Zyrtec '10mg'$  At bedtime   Deslym 2 tsp Twice daily  for cough As needed  Tessalon Three times a day  for As needed   Follow up with Dr. Lamonte Sakai as planned in 3 months and As needed   Please contact office for sooner follow up if symptoms do not improve or worsen or seek emergency care

## 2022-05-03 NOTE — Assessment & Plan Note (Signed)
Recurrent asthma exacerbation.  Previous testing showed elevated eosinophils and exhaled nitric oxide testing consistent with allergic phenotype.  We will restart Symbicort for maintenance inhaler.  Advised on oral inhaler care.  We will add spacer. Add Singulair daily.  Change Claritin to Zyrtec.  We will give a prednisone taper today.  Albuterol neb given in the office.  Patient education on steroid use.  Plan  Patient Instructions  Prednisone taper over next week.  Restart Symbicort 2 puffs Twice daily , add spacer , rinse after use.  Albuterol inhaler As needed   Begin Singulair '10mg'$  daily  Change Claritin to Zyrtec '10mg'$  At bedtime   Deslym 2 tsp Twice daily  for cough As needed  Tessalon Three times a day  for As needed   Follow up with Dr. Lamonte Sakai as planned in 3 months and As needed   Please contact office for sooner follow up if symptoms do not improve or worsen or seek emergency care

## 2022-05-03 NOTE — Progress Notes (Signed)
$'@Patient'U$  ID: Kristeen Miss, male    DOB: May 13, 1946, 76 y.o.   MRN: [id number]  Chief Complaint  Patient presents with   Follow-up    Referring provider: Gaynelle Arabian, MD  HPI: 76 year old male never smoker followed for asthma and chronic cough felt secondary to rhinitis and GERD  TEST/EVENTS :  06/04/2020 PFTs: FVC 3.5 (95), FEV1 2.23 (84), ratio 62, TLC 91%, DLCOcor 103%. No BD. Mild obstructive airway disease without reversibility and normal diffusion capacity   12/2021 FENO 43 ppb, Absolute eos 400   05/03/2022 Acute OV : Asthma  Patient presents for an acute office visit.  Patient complains over the last 3 weeks he has had increased cough, wheezing and shortness of breath.  Patient states he has been having recurrent asthma flares over the last 6 months.  This is his third asthma flare.  He has received 2 steroid tapers previously.  Patient was given a prednisone taper with improvement in symptoms.  He had another exacerbation and was given a prednisone taper and started on Symbicort.  Patient says his symptoms did improve however recently was taken off of Symbicort due to hoarseness.  Patient complains that his wheezing and coughing are worse at night.  He gets into severe coughing fits during the daytime.  He denies any fever, discolored mucus, hemoptysis, chest pain, orthopnea or calf pain.  Patient is taking Claritin daily.  He has had increased albuterol use.    No Known Allergies  Immunization History  Administered Date(s) Administered   Moderna Sars-Covid-2 Vaccination 10/07/2019, 10/24/2019, 11/05/2019, 06/22/2020   Pneumococcal Conjugate-13 10/22/2014   Pneumococcal Polysaccharide-23 03/24/2012   Td 03/23/2005   Tdap 09/03/2011, 06/15/2016    Past Medical History:  Diagnosis Date   Benign localized prostatic hyperplasia with lower urinary tract symptoms (LUTS)    Diverticulosis    GERD (gastroesophageal reflux disease)    watch diet   Glaucoma, both eyes     History of adenomatous polyp of colon    History of syncope    per pt has hx of syncopal episodes due to orthostatic hypotension,  last one 04/ 2019 and before that 2017   Hypercholesteremia    Hypertension    Mild intermittent asthma    Personal history of malignant melanoma 1998   s/p  wide local excision from mid lower back ,  per pt sln's negative and no recurrence   Prediabetes    Wears glasses     Tobacco History: Social History   Tobacco Use  Smoking Status Never  Smokeless Tobacco Never   Counseling given: Not Answered   Outpatient Medications Prior to Visit  Medication Sig Dispense Refill   albuterol (VENTOLIN HFA) 108 (90 Base) MCG/ACT inhaler INHALE 2 PUFFS BY MOUTH EVERY 4 HOURS ASNEEDED FOR WHEEZING OR SHORTNESS OF BREATH 8.5 g 2   ALPHAGAN P 0.1 % SOLN Place 1 drop into both eyes 2 (two) times daily.      amLODipine (NORVASC) 5 MG tablet Take 5 mg by mouth at bedtime.      dorzolamide-timolol (COSOPT) 22.3-6.8 MG/ML ophthalmic solution Place 1 drop into both eyes 2 (two) times daily.      famotidine (PEPCID) 20 MG tablet 1 tablet     latanoprost (XALATAN) 0.005 % ophthalmic solution      loratadine (CLARITIN) 10 MG tablet Take 1 tablet (10 mg total) by mouth daily. 90 tablet 3   oxybutynin (DITROPAN-XL) 10 MG 24 hr tablet Take 10 mg by mouth as  needed.     pravastatin (PRAVACHOL) 40 MG tablet Take 40 mg by mouth daily.     valsartan (DIOVAN) 80 MG tablet Take 80 mg by mouth daily.     ALLERGY RELIEF 10 MG tablet TAKE 1 TABLET BY MOUTH DAILY 30 tablet 0   predniSONE (DELTASONE) 10 MG tablet 4 tabs for 2 days, then 3 tabs for 2 days, 2 tabs for 2 days, then 1 tab for 2 days, then stop 20 tablet 0   No facility-administered medications prior to visit.     Review of Systems:   Constitutional:   No  weight loss, night sweats,  Fevers, chills, fatigue, or  lassitude.  HEENT:   No headaches,  Difficulty swallowing,  Tooth/dental problems, or  Sore throat,                 No sneezing, itching, ear ache,  +nasal congestion, post nasal drip,   CV:  No chest pain,  Orthopnea, PND, swelling in lower extremities, anasarca, dizziness, palpitations, syncope.   GI  No heartburn, indigestion, abdominal pain, nausea, vomiting, diarrhea, change in bowel habits, loss of appetite, bloody stools.   Resp: .  No chest wall deformity  Skin: no rash or lesions.  GU: no dysuria, change in color of urine, no urgency or frequency.  No flank pain, no hematuria   MS:  No joint pain or swelling.  No decreased range of motion.  No back pain.    Physical Exam  BP 128/82 (BP Location: Left Arm, Cuff Size: Normal)   Pulse 78   Temp 98.9 F (37.2 C)   Ht '5\' 7"'$  (1.702 m)   Wt 166 lb (75.3 kg)   SpO2 99%   BMI 26.00 kg/m   GEN: A/Ox3; pleasant , NAD, well nourished    HEENT:  [place]/AT,  EACs-clear, TMs-wnl, NOSE-clear, THROAT-clear, no lesions, no postnasal drip or exudate noted.  No stridor.  NECK:  Supple w/ fair ROM; no JVD; normal carotid impulses w/o bruits; no thyromegaly or nodules palpated; no lymphadenopathy.    RESP few expiratory wheezes.  Speaks in full sentences.  no accessory muscle use, no dullness to percussion  CARD:  RRR, no m/r/g, no peripheral edema, pulses intact, no cyanosis or clubbing.  GI:   Soft & nt; nml bowel sounds; no organomegaly or masses detected.   Musco: Warm bil, no deformities or joint swelling noted.   Neuro: alert, no focal deficits noted.    Skin: Warm, no lesions or rashes    Lab Results:  CBC    Component Value Date/Time   WBC 6.3 12/23/2021 0958   RBC 5.10 12/23/2021 0958   HGB 15.3 12/23/2021 0958   HCT 45.4 12/23/2021 0958   PLT 208.0 12/23/2021 0958   MCV 89.0 12/23/2021 0958   MCH 30.1 06/30/2018 0957   MCHC 33.7 12/23/2021 0958   RDW 13.4 12/23/2021 0958   LYMPHSABS 1.4 12/23/2021 0958   MONOABS 0.6 12/23/2021 0958   EOSABS 0.4 12/23/2021 0958   BASOSABS 0.1 12/23/2021 0958    BMET     Component Value Date/Time   NA 142 06/30/2018 0957   K 4.6 06/30/2018 0957   CL 111 06/30/2018 0957   CO2 23 06/30/2018 0957   GLUCOSE 109 (H) 06/30/2018 0957   BUN 17 06/30/2018 0957   CREATININE 1.23 06/30/2018 0957   CALCIUM 9.5 06/30/2018 0957   GFRNONAA 57 (L) 06/30/2018 0957   GFRAA >60 06/30/2018 0957    BNP No  results found for: "BNP"  ProBNP No results found for: "PROBNP"  Imaging: No results found.       Latest Ref Rng & Units 06/04/2020   10:51 AM 03/01/2017    2:47 PM  PFT Results  FVC-Pre L 3.40  3.50   FVC-Predicted Pre % 92  89   FVC-Post L 3.50  3.70   FVC-Predicted Post % 95  94   Pre FEV1/FVC % % 66  69   Post FEV1/FCV % % 62  73   FEV1-Pre L 2.23  2.40   FEV1-Predicted Pre % 84  83   FEV1-Post L 2.18  2.71   DLCO uncorrected ml/min/mmHg 23.20  22.96   DLCO UNC% % 103  81   DLCO corrected ml/min/mmHg 23.20  22.77   DLCO COR %Predicted % 103  80   DLVA Predicted % 109  100   TLC L 5.68  5.48   TLC % Predicted % 91  85   RV % Predicted % 100  80     No results found for: "NITRICOXIDE"      Assessment & Plan:   Acute asthma exacerbation Recurrent asthma exacerbation.  Previous testing showed elevated eosinophils and exhaled nitric oxide testing consistent with allergic phenotype.  We will restart Symbicort for maintenance inhaler.  Advised on oral inhaler care.  We will add spacer. Add Singulair daily.  Change Claritin to Zyrtec.  We will give a prednisone taper today.  Albuterol neb given in the office.  Patient education on steroid use.  Plan  Patient Instructions  Prednisone taper over next week.  Restart Symbicort 2 puffs Twice daily , add spacer , rinse after use.  Albuterol inhaler As needed   Begin Singulair '10mg'$  daily  Change Claritin to Zyrtec '10mg'$  At bedtime   Deslym 2 tsp Twice daily  for cough As needed  Tessalon Three times a day  for As needed   Follow up with Dr. Lamonte Sakai as planned in 3 months and As needed   Please  contact office for sooner follow up if symptoms do not improve or worsen or seek emergency care          Seasonal allergies Flare of allergies.  Add in Singulair daily.  Change Claritin to Zyrtec   Plan  Patient Instructions  Prednisone taper over next week.  Restart Symbicort 2 puffs Twice daily , add spacer , rinse after use.  Albuterol inhaler As needed   Begin Singulair '10mg'$  daily  Change Claritin to Zyrtec '10mg'$  At bedtime   Deslym 2 tsp Twice daily  for cough As needed  Tessalon Three times a day  for As needed   Follow up with Dr. Lamonte Sakai as planned in 3 months and As needed   Please contact office for sooner follow up if symptoms do not improve or worsen or seek emergency care            Rexene Edison, NP 05/03/2022

## 2022-05-03 NOTE — Assessment & Plan Note (Signed)
Flare of allergies.  Add in Singulair daily.  Change Claritin to Zyrtec   Plan  Patient Instructions  Prednisone taper over next week.  Restart Symbicort 2 puffs Twice daily , add spacer , rinse after use.  Albuterol inhaler As needed   Begin Singulair '10mg'$  daily  Change Claritin to Zyrtec '10mg'$  At bedtime   Deslym 2 tsp Twice daily  for cough As needed  Tessalon Three times a day  for As needed   Follow up with Dr. Lamonte Sakai as planned in 3 months and As needed   Please contact office for sooner follow up if symptoms do not improve or worsen or seek emergency care

## 2022-05-04 ENCOUNTER — Telehealth: Payer: Self-pay | Admitting: Adult Health

## 2022-05-05 ENCOUNTER — Ambulatory Visit: Payer: Medicare PPO | Admitting: Nurse Practitioner

## 2022-05-05 NOTE — Telephone Encounter (Signed)
Called and spoke to patient about his spacer and inhaler. Went over instructions with patient and he verbalized understanding. Nothing further needed at this time

## 2022-05-10 DIAGNOSIS — H401133 Primary open-angle glaucoma, bilateral, severe stage: Secondary | ICD-10-CM | POA: Diagnosis not present

## 2022-05-10 DIAGNOSIS — H26491 Other secondary cataract, right eye: Secondary | ICD-10-CM | POA: Diagnosis not present

## 2022-05-10 DIAGNOSIS — Z961 Presence of intraocular lens: Secondary | ICD-10-CM | POA: Diagnosis not present

## 2022-05-12 DIAGNOSIS — H26491 Other secondary cataract, right eye: Secondary | ICD-10-CM | POA: Diagnosis not present

## 2022-06-21 DIAGNOSIS — Z85828 Personal history of other malignant neoplasm of skin: Secondary | ICD-10-CM | POA: Diagnosis not present

## 2022-06-21 DIAGNOSIS — D485 Neoplasm of uncertain behavior of skin: Secondary | ICD-10-CM | POA: Diagnosis not present

## 2022-06-21 DIAGNOSIS — L570 Actinic keratosis: Secondary | ICD-10-CM | POA: Diagnosis not present

## 2022-06-21 DIAGNOSIS — Z85820 Personal history of malignant melanoma of skin: Secondary | ICD-10-CM | POA: Diagnosis not present

## 2022-06-21 DIAGNOSIS — L820 Inflamed seborrheic keratosis: Secondary | ICD-10-CM | POA: Diagnosis not present

## 2022-06-21 DIAGNOSIS — L821 Other seborrheic keratosis: Secondary | ICD-10-CM | POA: Diagnosis not present

## 2022-08-05 ENCOUNTER — Ambulatory Visit: Payer: Medicare PPO | Admitting: Nurse Practitioner

## 2022-08-09 ENCOUNTER — Encounter: Payer: Self-pay | Admitting: Nurse Practitioner

## 2022-08-09 ENCOUNTER — Ambulatory Visit: Payer: Medicare PPO | Admitting: Nurse Practitioner

## 2022-08-09 VITALS — BP 142/86 | HR 59 | Temp 98.4°F | Ht 67.0 in | Wt 166.0 lb

## 2022-08-09 DIAGNOSIS — R058 Other specified cough: Secondary | ICD-10-CM

## 2022-08-09 DIAGNOSIS — J4530 Mild persistent asthma, uncomplicated: Secondary | ICD-10-CM | POA: Diagnosis not present

## 2022-08-09 MED ORDER — ALBUTEROL SULFATE HFA 108 (90 BASE) MCG/ACT IN AERS: INHALATION_SPRAY | RESPIRATORY_TRACT | 2 refills | Status: DC

## 2022-08-09 MED ORDER — SYMBICORT 80-4.5 MCG/ACT IN AERO: 2.0000 | INHALATION_SPRAY | Freq: Two times a day (BID) | RESPIRATORY_TRACT | 6 refills | Status: DC

## 2022-08-09 NOTE — Assessment & Plan Note (Signed)
Stable. Minimal hoarseness. Continue current regimen and spacer with inhaler.

## 2022-08-09 NOTE — Progress Notes (Signed)
$'@Patient'Q$  ID: Kristeen Miss, male    DOB: Apr 12, 1946, 76 y.o.   MRN: [id number]  Chief Complaint  Patient presents with   Follow-up    Referring provider: Gaynelle Arabian, MD  HPI: 76 year old male, never smoker followed for asthma and upper airway cough syndrome.  He is a patient Dr. Agustina Caroli and was last seen in office on 05/03/2022 by Parrett NP.  Past medical history significant for hypertension, GERD, BPH, HLD, prediabetes.  TEST/EVENTS:  06/04/2020 PFTs: FVC 95, FEV1 84, ratio 62, TLC 91, DLCOcor 103%.  No BD.  Mild obstructive airway disease without reversibility and normal diffusion capacity. 10/28/2021 CXR 2 view: Both lungs were clear without any evidence of acute process 12/23/2021 FeNO 43 ppb; absolute eos 400  10/28/2021: OV with Dr. Lamonte Sakai.  Worsening DOE, wheeze and cough over the past several months.  Started back on Pepcid; paradoxical response to PPI in past and did not want to retry.  Felt allergic rhinitis well-controlled on current allergy regimen.  CXR without acute process no evidence of significant bronchospasm on exam.  Treated for upper irritation with pred taper.  11/11/2021: OV with Claire Bridge NP for follow-up.  Reported feeling much better.  Breathing was stable and had not noticed any further wheezing.  Cough was resolved.  He was concerned going into spring that his symptoms might flareup again as he feels like his symptoms always get worse with springtime and working in the yard.  Advised to monitor his symptoms and notify us if worsening occurs.  Continue on as needed albuterol, Claritin and Pepcid.  12/23/2021: OV with Elianie Hubers NP for asthma flare.  He has had some increased shortness of breath, wheezing and coughing over the last few weeks.  Shortness of breath is primarily upon exertion.  Cough is primarily dry and paroxysmal at times; usually happens when he starts becoming short of breath.  He has been having to use his rescue inhaler multiple times a day.  Has been waking  up frequently throughout the week with wheezing and increased shortness of breath as well.  He feels like his allergy symptoms are well-controlled on current regimen. No breakthrough GERD symptoms. Denies recent sick exposures, fevers, hemoptysis, weight loss, anorexia, or leg swelling. FeNO 43 ppb. Treated with prednisone taper and started on ICS/LABA therapy. CBC with diff with elevated eosinophils; IgE nl.  02/03/2022: OV with Dr. Lamonte Sakai. Recent flare in May. Benefited significantly from prednisone. Still on Symbicort; has a hoarse voice but no stridor. Unclear whether he is benefiting from use of not. Trial off to see if he tolerates.   05/03/2022: OV with Parrett,NP. Increased cough, wheezing, dyspnea over the past 3 weeks. Recurrent asthma flares over the last 6 months. This is his third. He has received 2 steroid tapers previously. He was started on symbicort after the second. Taken off in June due to hoarseness. Treated for recurrent asthma exacerbation. Restarted Symbicort with spacer. Added singulair daily. Changed claritin to zyrtec. Treated with prednisone taper and albuterol. Cough control measures.  08/09/2022: Today - follow up Patient presents today for follow up. He has been doing well since he was here in September. Feels like the Symbicort is controlling his symptoms. This is the longest he's gone without a flare in the past year. Feels like his breathing is good and he's able to do things around the house without any trouble. He denies any cough, wheezing, chest congestion. He is using his Symbicort on a regular basis. Does feel like the spacer  helped with his hoarseness, which is still present but minimal. Hasn't had to use his rescue since September. Sinus symptoms are stable. He is going on a river cruise for almost 12 days at the end of April. Wants to know if he can take some prednisone with him in case he has trouble.   No Known Allergies  Immunization History  Administered Date(s)  Administered   Moderna Sars-Covid-2 Vaccination 10/07/2019, 10/24/2019, 11/05/2019, 06/22/2020   Pneumococcal Conjugate-13 10/22/2014   Pneumococcal Polysaccharide-23 03/24/2012   Td 03/23/2005   Tdap 09/03/2011, 06/15/2016    Past Medical History:  Diagnosis Date   Benign localized prostatic hyperplasia with lower urinary tract symptoms (LUTS)    Diverticulosis    GERD (gastroesophageal reflux disease)    watch diet   Glaucoma, both eyes    History of adenomatous polyp of colon    History of syncope    per pt has hx of syncopal episodes due to orthostatic hypotension,  last one 04/ 2019 and before that 2017   Hypercholesteremia    Hypertension    Mild intermittent asthma    Personal history of malignant melanoma 1998   s/p  wide local excision from mid lower back ,  per pt sln's negative and no recurrence   Prediabetes    Wears glasses     Tobacco History: Social History   Tobacco Use  Smoking Status Never  Smokeless Tobacco Never   Counseling given: Not Answered   Outpatient Medications Prior to Visit  Medication Sig Dispense Refill   ALPHAGAN P 0.1 % SOLN Place 1 drop into both eyes 2 (two) times daily.      amLODipine (NORVASC) 5 MG tablet Take 5 mg by mouth at bedtime.      benzonatate (TESSALON) 200 MG capsule Take 1 capsule (200 mg total) by mouth 3 (three) times daily as needed. 60 capsule 1   dorzolamide-timolol (COSOPT) 22.3-6.8 MG/ML ophthalmic solution Place 1 drop into both eyes 2 (two) times daily.      famotidine (PEPCID) 20 MG tablet 1 tablet     latanoprost (XALATAN) 0.005 % ophthalmic solution      montelukast (SINGULAIR) 10 MG tablet Take 1 tablet (10 mg total) by mouth at bedtime. 30 tablet 5   oxybutynin (DITROPAN-XL) 10 MG 24 hr tablet Take 10 mg by mouth as needed.     pravastatin (PRAVACHOL) 40 MG tablet Take 40 mg by mouth daily.     valsartan (DIOVAN) 80 MG tablet Take 80 mg by mouth daily.     albuterol (VENTOLIN HFA) 108 (90 Base) MCG/ACT  inhaler INHALE 2 PUFFS BY MOUTH EVERY 4 HOURS ASNEEDED FOR WHEEZING OR SHORTNESS OF BREATH 8.5 g 2   SYMBICORT 80-4.5 MCG/ACT inhaler Inhale 2 puffs into the lungs.     loratadine (CLARITIN) 10 MG tablet Take 1 tablet (10 mg total) by mouth daily. 90 tablet 3   predniSONE (DELTASONE) 10 MG tablet 4 tabs for 2 days, then 3 tabs for 2 days, 2 tabs for 2 days, then 1 tab for 2 days, then stop 20 tablet 0   No facility-administered medications prior to visit.     Review of Systems:   Constitutional: No weight loss or gain, night sweats, fevers, chills, fatigue, or lassitude. HEENT: No headaches, difficulty swallowing, tooth/dental problems, or sore throat. No sneezing, itching, ear ache, nasal congestion, or post nasal drip CV:  No chest pain, orthopnea, PND, swelling in lower extremities, anasarca, dizziness, palpitations, syncope Resp: No shortness  of breath with exertion or at rest. No cough. No excess mucus or change in color of mucus. No wheezing. No hemoptysis. No chest wall deformity GI:  No heartburn, indigestion, abdominal pain, nausea, vomiting, diarrhea, change in bowel habits, loss of appetite, bloody stools.  Skin: No rash, lesions, ulcerations MSK:  No joint pain or swelling.  No decreased range of motion.  No back pain. Neuro: No dizziness or lightheadedness.  Psych: No depression or anxiety. Mood stable.     Physical Exam:  BP (!) 142/86 (BP Location: Right Arm, Patient Position: Sitting, Cuff Size: Normal)   Pulse (!) 59   Temp 98.4 F (36.9 C) (Oral)   Ht '5\' 7"'$  (1.702 m)   Wt 166 lb (75.3 kg)   SpO2 98%   BMI 26.00 kg/m   GEN: Pleasant, interactive, well-appearing; in no acute distress. HEENT:  Normocephalic and atraumatic. PERRLA. Sclera white. Nasal turbinates pink, moist and patent bilaterally. No rhinorrhea present. Oropharynx pink and moist, without exudate or edema. No lesions, ulcerations, or postnasal drip.  NECK:  Supple w/ fair ROM. No JVD present. Normal  carotid impulses w/o bruits. Thyroid symmetrical with no goiter or nodules palpated. No lymphadenopathy.   CV: RRR, no m/r/g, no peripheral edema. Pulses intact, +2 bilaterally. No cyanosis, pallor or clubbing. PULMONARY:  Unlabored, regular breathing. Clear bilaterally A&P w/o wheezes/rales/rhonchi. No accessory muscle use. No dullness to percussion. GI: BS present and normoactive. Soft, non-tender to palpation. No organomegaly or masses detected.  MSK: No erythema, warmth or tenderness. Cap refil <2 sec all extrem. No deformities or joint swelling noted.  Neuro: A/Ox3. No focal deficits noted.   Skin: Warm, no lesions or rashe Psych: Normal affect and behavior. Judgement and thought content appropriate.     Lab Results:  CBC    Component Value Date/Time   WBC 6.3 12/23/2021 0958   RBC 5.10 12/23/2021 0958   HGB 15.3 12/23/2021 0958   HCT 45.4 12/23/2021 0958   PLT 208.0 12/23/2021 0958   MCV 89.0 12/23/2021 0958   MCH 30.1 06/30/2018 0957   MCHC 33.7 12/23/2021 0958   RDW 13.4 12/23/2021 0958   LYMPHSABS 1.4 12/23/2021 0958   MONOABS 0.6 12/23/2021 0958   EOSABS 0.4 12/23/2021 0958   BASOSABS 0.1 12/23/2021 0958    BMET    Component Value Date/Time   NA 142 06/30/2018 0957   K 4.6 06/30/2018 0957   CL 111 06/30/2018 0957   CO2 23 06/30/2018 0957   GLUCOSE 109 (H) 06/30/2018 0957   BUN 17 06/30/2018 0957   CREATININE 1.23 06/30/2018 0957   CALCIUM 9.5 06/30/2018 0957   GFRNONAA 57 (L) 06/30/2018 0957   GFRAA >60 06/30/2018 0957    BNP No results found for: "BNP"   Imaging:  No results found.        Latest Ref Rng & Units 06/04/2020   10:51 AM 03/01/2017    2:47 PM  PFT Results  FVC-Pre L 3.40  3.50   FVC-Predicted Pre % 92  89   FVC-Post L 3.50  3.70   FVC-Predicted Post % 95  94   Pre FEV1/FVC % % 66  69   Post FEV1/FCV % % 62  73   FEV1-Pre L 2.23  2.40   FEV1-Predicted Pre % 84  83   FEV1-Post L 2.18  2.71   DLCO uncorrected ml/min/mmHg 23.20   22.96   DLCO UNC% % 103  81   DLCO corrected ml/min/mmHg 23.20  22.77  DLCO COR %Predicted % 103  80   DLVA Predicted % 109  100   TLC L 5.68  5.48   TLC % Predicted % 91  85   RV % Predicted % 100  80     No results found for: "NITRICOXIDE"      Assessment & Plan:   Mild persistent asthma Compensated on current regimen. He has done very well with maintenance ICS/LABA therapy. No flares since restarting in September. We will keep him on this and singulair for trigger prevention. We can fill a prednisone taper for him to have on hand at his next follow up, should he get have trouble on his overseas trip.  Patient Instructions  Continue Albuterol inhaler 2 puffs every 6 hours as needed for shortness of breath or wheezing. Notify if symptoms persist despite rescue inhaler/neb use. Continue Claritin (loratidine) 10 mg daily for allergies Continue Pepcid (famotidine) 20 mg daily  Continue Symbicort 2 puffs Twice daily. Brush tongue and rinse mouth afterwards. T Continue montelukast (singulair) 1 tab At bedtime    Follow up in 4 months with Dr. Lamonte Sakai. If symptoms worsen, please contact office for sooner follow up or seek emergency care.   Upper airway cough syndrome Stable. Minimal hoarseness. Continue current regimen and spacer with inhaler.    I spent 28 minutes of dedicated to the care of this patient on the date of this encounter to include pre-visit review of records, face-to-face time with the patient discussing conditions above, post visit ordering of testing, clinical documentation with the electronic health record, making appropriate referrals as documented, and communicating necessary findings to members of the patients care team.  Clayton Bibles, NP 08/09/2022  Pt aware and understands NP's role.

## 2022-08-09 NOTE — Assessment & Plan Note (Signed)
Compensated on current regimen. He has done very well with maintenance ICS/LABA therapy. No flares since restarting in September. We will keep him on this and singulair for trigger prevention. We can fill a prednisone taper for him to have on hand at his next follow up, should he get have trouble on his overseas trip.  Patient Instructions  Continue Albuterol inhaler 2 puffs every 6 hours as needed for shortness of breath or wheezing. Notify if symptoms persist despite rescue inhaler/neb use. Continue Claritin (loratidine) 10 mg daily for allergies Continue Pepcid (famotidine) 20 mg daily  Continue Symbicort 2 puffs Twice daily. Brush tongue and rinse mouth afterwards. T Continue montelukast (singulair) 1 tab At bedtime    Follow up in 4 months with Dr. Lamonte Sakai. If symptoms worsen, please contact office for sooner follow up or seek emergency care.

## 2022-08-09 NOTE — Patient Instructions (Addendum)
Continue Albuterol inhaler 2 puffs every 6 hours as needed for shortness of breath or wheezing. Notify if symptoms persist despite rescue inhaler/neb use. Continue Claritin (loratidine) 10 mg daily for allergies Continue Pepcid (famotidine) 20 mg daily  Continue Symbicort 2 puffs Twice daily. Brush tongue and rinse mouth afterwards. T Continue montelukast (singulair) 1 tab At bedtime    Follow up in 4 months with Dr. Lamonte Sakai. If symptoms worsen, please contact office for sooner follow up or seek emergency care.

## 2022-08-22 ENCOUNTER — Emergency Department (HOSPITAL_COMMUNITY): Payer: Medicare PPO

## 2022-08-22 ENCOUNTER — Emergency Department (HOSPITAL_COMMUNITY)
Admission: EM | Admit: 2022-08-22 | Discharge: 2022-08-22 | Disposition: A | Discharge status: Home / Self Care | Payer: Medicare PPO | Attending: Emergency Medicine | Admitting: Emergency Medicine

## 2022-08-22 ENCOUNTER — Encounter (HOSPITAL_COMMUNITY): Payer: Self-pay

## 2022-08-22 DIAGNOSIS — R531 Weakness: Secondary | ICD-10-CM | POA: Insufficient documentation

## 2022-08-22 DIAGNOSIS — G5132 Clonic hemifacial spasm, left: Secondary | ICD-10-CM | POA: Diagnosis not present

## 2022-08-22 DIAGNOSIS — G319 Degenerative disease of nervous system, unspecified: Secondary | ICD-10-CM | POA: Diagnosis not present

## 2022-08-22 DIAGNOSIS — M62838 Other muscle spasm: Secondary | ICD-10-CM | POA: Insufficient documentation

## 2022-08-22 DIAGNOSIS — I10 Essential (primary) hypertension: Secondary | ICD-10-CM | POA: Insufficient documentation

## 2022-08-22 LAB — CBC WITH DIFFERENTIAL/PLATELET
Abs Immature Granulocytes: 0.03 10*3/uL (ref 0.00–0.07)
Basophils Absolute: 0 10*3/uL (ref 0.0–0.1)
Basophils Relative: 0 %
Eosinophils Absolute: 0.2 10*3/uL (ref 0.0–0.5)
Eosinophils Relative: 2 %
HCT: 46.2 % (ref 39.0–52.0)
Hemoglobin: 15.4 g/dL (ref 13.0–17.0)
Immature Granulocytes: 0 %
Lymphocytes Relative: 26 %
Lymphs Abs: 2.6 10*3/uL (ref 0.7–4.0)
MCH: 30.2 pg (ref 26.0–34.0)
MCHC: 33.3 g/dL (ref 30.0–36.0)
MCV: 90.6 fL (ref 80.0–100.0)
Monocytes Absolute: 1.1 10*3/uL — ABNORMAL HIGH (ref 0.1–1.0)
Monocytes Relative: 11 %
Neutro Abs: 5.9 10*3/uL (ref 1.7–7.7)
Neutrophils Relative %: 61 %
Platelets: 211 10*3/uL (ref 150–400)
RBC: 5.1 MIL/uL (ref 4.22–5.81)
RDW: 12.8 % (ref 11.5–15.5)
WBC: 9.9 10*3/uL (ref 4.0–10.5)
nRBC: 0 % (ref 0.0–0.2)

## 2022-08-22 LAB — COMPREHENSIVE METABOLIC PANEL
ALT: 33 U/L (ref 0–44)
AST: 28 U/L (ref 15–41)
Albumin: 4.2 g/dL (ref 3.5–5.0)
Alkaline Phosphatase: 53 U/L (ref 38–126)
Anion gap: 8 (ref 5–15)
BUN: 14 mg/dL (ref 8–23)
CO2: 26 mmol/L (ref 22–32)
Calcium: 9.2 mg/dL (ref 8.9–10.3)
Chloride: 106 mmol/L (ref 98–111)
Creatinine, Ser: 1.16 mg/dL (ref 0.61–1.24)
GFR, Estimated: >60 mL/min (ref >60)
Glucose, Bld: 94 mg/dL (ref 70–99)
Potassium: 4 mmol/L (ref 3.5–5.1)
Sodium: 140 mmol/L (ref 135–145)
Total Bilirubin: 0.5 mg/dL (ref 0.3–1.2)
Total Protein: 7.3 g/dL (ref 6.5–8.1)

## 2022-08-22 MED ORDER — LORAZEPAM 2 MG/ML IJ SOLN
1.0000 mg | Freq: Once | INTRAMUSCULAR | Status: AC
  Administered 2022-08-22: 1 mg via INTRAVENOUS
  Filled 2022-08-22: qty 1

## 2022-08-22 MED ORDER — GABAPENTIN 300 MG PO CAPS
300.0000 mg | ORAL_CAPSULE | Freq: Once | ORAL | Status: AC
  Administered 2022-08-22: 300 mg via ORAL
  Filled 2022-08-22: qty 1

## 2022-08-22 MED ORDER — GABAPENTIN 300 MG PO CAPS: 300.0000 mg | ORAL_CAPSULE | Freq: Three times a day (TID) | ORAL | 0 refills | Status: DC

## 2022-08-22 NOTE — ED Provider Notes (Signed)
[facility] DEPT Provider Note   CSN: [id number] Arrival date & time: 08/22/22  1707     History {Add pertinent medical, surgical, social history, OB history to HPI:1} Chief Complaint  Patient presents with   Spasms         Spencer Lloyd. is a 76 y.o. male.  Patient has a history of bronchospasm and hypertension.  He has been experiencing uncontrolled movement of left arm and left leg for the last 4 to 5 days   Weakness      Home Medications Prior to Admission medications   Medication Sig Start Date End Date Taking? Authorizing Provider  gabapentin (NEURONTIN) 300 MG capsule Take 1 capsule (300 mg total) by mouth 3 (three) times daily. 08/22/22  Yes Milton Ferguson, MD  albuterol (VENTOLIN HFA) 108 (90 Base) MCG/ACT inhaler INHALE 2 PUFFS BY MOUTH EVERY 4 HOURS ASNEEDED FOR WHEEZING OR SHORTNESS OF BREATH 08/09/22   Cobb, Karie Schwalbe, NP  ALPHAGAN P 0.1 % SOLN Place 1 drop into both eyes 2 (two) times daily.  11/27/15   [provider]  amLODipine (NORVASC) 5 MG tablet Take 5 mg by mouth at bedtime.  11/05/15   [provider]  benzonatate (TESSALON) 200 MG capsule Take 1 capsule (200 mg total) by mouth 3 (three) times daily as needed. 05/03/22 05/03/23  Parrett, Fonnie Mu, NP  dorzolamide-timolol (COSOPT) 22.3-6.8 MG/ML ophthalmic solution Place 1 drop into both eyes 2 (two) times daily.  10/25/15   [provider]  famotidine (PEPCID) 20 MG tablet 1 tablet 10/21/21   [provider]  latanoprost (XALATAN) 0.005 % ophthalmic solution  05/05/20   [provider]  montelukast (SINGULAIR) 10 MG tablet Take 1 tablet (10 mg total) by mouth at bedtime. 05/03/22   Parrett, Fonnie Mu, NP  oxybutynin (DITROPAN-XL) 10 MG 24 hr tablet Take 10 mg by mouth as needed.    [provider]  pravastatin (PRAVACHOL) 40 MG tablet Take 40 mg by mouth daily.    [provider]  SYMBICORT 80-4.5 MCG/ACT inhaler  Inhale 2 puffs into the lungs 2 (two) times daily. 08/09/22   Cobb, Karie Schwalbe, NP  valsartan (DIOVAN) 80 MG tablet Take 80 mg by mouth daily.    [provider]      Allergies    Patient has no known allergies.    Review of Systems   Review of Systems  Neurological:  Positive for weakness.    Physical Exam Updated Vital Signs BP 90/74   Pulse (!) 58   Temp 97.7 F (36.5 C) (Oral)   Resp 20   SpO2 95%  Physical Exam  ED Results / Procedures / Treatments   Labs (all labs ordered are listed, but only abnormal results are displayed) Labs Reviewed  CBC WITH DIFFERENTIAL/PLATELET - Abnormal; Notable for the following components:      Result Value   Monocytes Absolute 1.1 (*)    All other components within normal limits  COMPREHENSIVE METABOLIC PANEL    EKG None  Radiology CT Head Wo Contrast  Result Date: 08/22/2022 CLINICAL DATA:  Spasms left-side EXAM: CT HEAD WITHOUT CONTRAST CT CERVICAL SPINE WITHOUT CONTRAST TECHNIQUE: Multidetector CT imaging of the head and cervical spine was performed following the standard protocol without intravenous contrast. Multiplanar CT image reconstructions of the cervical spine were also generated. RADIATION DOSE REDUCTION: This exam was performed according to the departmental dose-optimization program which includes automated exposure control, adjustment of the mA  and/or kV according to patient size and/or use of iterative reconstruction technique. COMPARISON:  None Available. FINDINGS: CT HEAD FINDINGS Brain: No acute territorial infarction, hemorrhage or intracranial mass. Moderate atrophy. Mildly prominent ventricles felt secondary to atrophy. Vascular: No hyperdense vessels.  No unexpected calcification Skull: Normal. Negative for fracture or focal lesion. Sinuses/Orbits: No acute finding. Other: None CT CERVICAL SPINE FINDINGS Alignment: No subluxation.  Facet alignment is within normal limits. Skull base and vertebrae: No acute  fracture. No primary bone lesion or focal pathologic process. Soft tissues and spinal canal: No prevertebral fluid or swelling. No visible canal hematoma. Disc levels: Moderate severe diffuse degenerative change throughout the cervical spine with multilevel disc space narrowing and osteophyte. Advanced C1-C2 degenerative changes. Multilevel foraminal narrowing. Upper chest: Negative. Other: None IMPRESSION: No CT evidence for acute intracranial abnormality. Atrophy. Diffuse degenerative changes of the cervical spine. No acute osseous abnormality. Electronically Signed   By: Donavan Foil M.D.   On: 08/22/2022 19:53   CT Cervical Spine Wo Contrast  Result Date: 08/22/2022 CLINICAL DATA:  Spasms left-side EXAM: CT HEAD WITHOUT CONTRAST CT CERVICAL SPINE WITHOUT CONTRAST TECHNIQUE: Multidetector CT imaging of the head and cervical spine was performed following the standard protocol without intravenous contrast. Multiplanar CT image reconstructions of the cervical spine were also generated. RADIATION DOSE REDUCTION: This exam was performed according to the departmental dose-optimization program which includes automated exposure control, adjustment of the mA and/or kV according to patient size and/or use of iterative reconstruction technique. COMPARISON:  None Available. FINDINGS: CT HEAD FINDINGS Brain: No acute territorial infarction, hemorrhage or intracranial mass. Moderate atrophy. Mildly prominent ventricles felt secondary to atrophy. Vascular: No hyperdense vessels.  No unexpected calcification Skull: Normal. Negative for fracture or focal lesion. Sinuses/Orbits: No acute finding. Other: None CT CERVICAL SPINE FINDINGS Alignment: No subluxation.  Facet alignment is within normal limits. Skull base and vertebrae: No acute fracture. No primary bone lesion or focal pathologic process. Soft tissues and spinal canal: No prevertebral fluid or swelling. No visible canal hematoma. Disc levels: Moderate severe diffuse  degenerative change throughout the cervical spine with multilevel disc space narrowing and osteophyte. Advanced C1-C2 degenerative changes. Multilevel foraminal narrowing. Upper chest: Negative. Other: None IMPRESSION: No CT evidence for acute intracranial abnormality. Atrophy. Diffuse degenerative changes of the cervical spine. No acute osseous abnormality. Electronically Signed   By: Donavan Foil M.D.   On: 08/22/2022 19:53    Procedures Procedures  {Document cardiac monitor, telemetry assessment procedure when appropriate:1}  Medications Ordered in ED Medications  LORazepam (ATIVAN) injection 1 mg (1 mg Intravenous Given 08/22/22 2148)  gabapentin (NEURONTIN) capsule 300 mg (300 mg Oral Given 08/22/22 2147)    ED Course/ Medical Decision Making/ A&P  I spoke with neurology and they suggested gabapentin.  Also he can take a benzo if needed acutely.  Patient was given 1/2 mg of Ativan and that helped.                         Medical Decision Making Amount and/or Complexity of Data Reviewed Labs: ordered. Radiology: ordered.  Risk Prescription drug management.   Patient with spastic motions of arms and legs.  He will follow-up with neurology and started on gabapentin  {Document critical care time when appropriate:1} {Document review of labs and clinical decision tools ie heart score, Chads2Vasc2 etc:1}  {Document your independent review of radiology images, and any outside records:1} {Document your discussion with family members, caretakers, and with  consultants:1} {Document social determinants of health affecting pt's care:1} {Document your decision making why or why not admission, treatments were needed:1} Final Clinical Impression(s) / ED Diagnoses Final diagnoses:  Muscle spasm    Rx / DC Orders ED Discharge Orders          Ordered    gabapentin (NEURONTIN) 300 MG capsule  3 times daily        08/22/22 2239

## 2022-08-22 NOTE — ED Triage Notes (Signed)
Pt arrived via POV, c/o unilateral left sided arm and leg spams, uncontrollable. Pt spouse states pt was dropping things and his smile appeared different. Then spasms became a lot worse

## 2022-08-22 NOTE — ED Notes (Signed)
Light blue and dark green sent to the lab.

## 2022-08-22 NOTE — Discharge Instructions (Addendum)
Follow-up with [facility].  Phone [phone].   In the next couple weeks

## 2022-08-25 DIAGNOSIS — R259 Unspecified abnormal involuntary movements: Secondary | ICD-10-CM | POA: Diagnosis not present

## 2022-08-27 ENCOUNTER — Telehealth: Payer: Self-pay | Admitting: Nurse Practitioner

## 2022-08-27 NOTE — Telephone Encounter (Signed)
PT calling because the last time he saw Ms. Cobb he was told to stop taking the Tessalon. Since then he is having "Christian Mate. Fox on Steroids" like spasms.  Dr.Zanmit saw him for a few minutes in the ER. He put symptoms down as "mild" but there is nothing mild about it, PT states,  AND it is getting worse.   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 The ER referred him to a Neurologist but he wanted to run this by Ms. Cobb. Please call to advise [phone]

## 2022-08-27 NOTE — Telephone Encounter (Signed)
Called and spoke with pt who states he was prescribed tessalon by at [place] 9/11 with her. Pt said that instead of taking tessalon as directed by doing it tid prn, pt was taking two tabs a day on a daily basis whether he was having problems with a cough or not.  Pt said that in December prior to his OV with [facility], he began taking one a day to have the Rx last until OV. Rx was not refilled by [facility] Jersey after OV 12/18.  Pt said around [place], he began having muscle spasms on his right side that got bad enough to the point that he went to the ED to be evaluated. Pt said since he has been home, he is still having the spasms and they are even happening more frequently than before.   Pt said that he was referred to a neurologist but was told that he could not get an appt with them for 5 weeks.  Pt said he has heard about tessalon withdrawal and wants to know if the provider has ever heard of this based off of what is currently  happening with him.   Katie, please advise.

## 2022-08-30 NOTE — Telephone Encounter (Signed)
Called and spoke with pt letting him know the info per KC and he verbalized understanding. Nothing further needed. 

## 2022-08-30 NOTE — Telephone Encounter (Signed)
No known withdrawal side effects. If he is still experiencing the muscle spasms, I recommend he see his PCP. They may be able to offer an alternative treatment or get him seen sooner. Thanks.

## 2022-08-31 ENCOUNTER — Encounter: Payer: Self-pay | Admitting: Neurology

## 2022-08-31 ENCOUNTER — Ambulatory Visit: Payer: Medicare PPO | Admitting: Neurology

## 2022-08-31 VITALS — BP 182/85 | HR 57 | Ht 67.0 in | Wt 162.0 lb

## 2022-08-31 DIAGNOSIS — G255 Other chorea: Secondary | ICD-10-CM

## 2022-08-31 NOTE — Progress Notes (Signed)
Chief Complaint  Patient presents with   New Patient (Initial Visit)    Rm 14 with hosp f/u with wife. Pt reports since d/c once per day      ASSESSMENT AND PLAN  Spencer Carden Byce. is a 77 y.o. 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   MEDICAL HISTORY:  Spencer Steege. is a 77 year old male, seen in request by his primary care physician from [facility] E Dr. Manus Gunning, Molly Maduro for evaluation of sudden onset left side abnormal movement, initial evaluation was on August 31, 2022    I reviewed and summarized the referring note. PMHX HTN HLD GERD Melanoma in 1998, BPH.  On August 17, 2022, he and his wife went down to her normal driving trip, couple hours into driving, he noticed sudden onset left foot uncontrollable movement,  Noticed obvious left arm involvement by December 27, it has been persistent since then, sometimes worse than the others, he reported 1 episode on December 31, while eating at [employer], he had such uncontrollable left arm and leg movements, leading to emergency room visit at [facility] reviewed CT head without contrast, no acute intracranial abnormality  CT cervical spine showed diffuse degenerative changes, laboratory evaluations normal CMP, CBC,  I was able to watch 1 video clip, when he has more large amplitude uncontrollable left arm and left leg movement, most consistent with left hemiballism, during today's interview, he also has frequent but much lesser amplitude of uncontrollable left arm and leg sudden jerking movement  PHYSICAL EXAM:   Vitals:   08/31/22 1524  BP: (!) 182/85  Pulse: (!) 57   Weight: 162 lb (73.5 kg)  Height: 5\' 7"  (1.702 m)     Body mass index is 25.37 kg/m.  PHYSICAL EXAMNIATION:  Gen: NAD, conversant, well nourised, well groomed                     Cardiovascular: Regular rate rhythm, no peripheral edema, warm, nontender. Eyes: Conjunctivae clear without exudates or hemorrhage Neck: Supple, no carotid bruits. Pulmonary: Clear to auscultation bilaterally   NEUROLOGICAL EXAM:  MENTAL STATUS: Speech/cognition: Awake, alert, oriented to history taking and casual conversation CRANIAL NERVES: CN II: Visual fields are full to confrontation. Pupils are round equal and briskly reactive to light. CN III, IV, VI: extraocular movement are normal. No ptosis. CN V: Facial sensation is intact to light touch CN VII: Face is symmetric with normal eye closure  CN VIII: Hearing is normal to causal conversation. CN IX, X: Phonation is normal. CN XI: Head turning and shoulder shrug are intact  MOTOR: Mild fixation of left upper extremity on rapid rotating movement, left leg drift, uncontrollable left arm, neck jerking movement  REFLEXES: Reflexes are 2+ and symmetric at the biceps, triceps, knees, and ankles. Plantar responses are flexor.  SENSORY: Intact to light touch, pinprick and vibratory sensation are intact in fingers and toes.  COORDINATION: There is no trunk or limb dysmetria noted.  GAIT/STANCE: Need push-up to get up from seated position, dragging left leg some  REVIEW OF SYSTEMS:  Full 14 system review of systems performed and notable only for as  above All other review of systems were negative.   ALLERGIES: No Known Allergies  HOME MEDICATIONS: Current Outpatient Medications  Medication Sig Dispense Refill   albuterol (VENTOLIN HFA) 108 (90 Base) MCG/ACT inhaler INHALE 2 PUFFS BY MOUTH EVERY 4 HOURS ASNEEDED FOR WHEEZING OR SHORTNESS OF BREATH 8.5 g 2   ALPHAGAN P 0.1 % SOLN Place 1 drop into both eyes 2 (two) times daily.       amLODipine (NORVASC) 5 MG tablet Take 5 mg by mouth at bedtime.      Cetirizine HCl (ZYRTEC PO) Take by mouth.     dorzolamide-timolol (COSOPT) 22.3-6.8 MG/ML ophthalmic solution Place 1 drop into both eyes 2 (two) times daily.      famotidine (PEPCID) 20 MG tablet 1 tablet     gabapentin (NEURONTIN) 300 MG capsule Take 1 capsule (300 mg total) by mouth 3 (three) times daily. 100 capsule 0   latanoprost (XALATAN) 0.005 % ophthalmic solution      montelukast (SINGULAIR) 10 MG tablet Take 1 tablet (10 mg total) by mouth at bedtime. 30 tablet 5   pravastatin (PRAVACHOL) 40 MG tablet Take 40 mg by mouth daily.     SYMBICORT 80-4.5 MCG/ACT inhaler Inhale 2 puffs into the lungs 2 (two) times daily. 1 each 6   valsartan (DIOVAN) 80 MG tablet Take 80 mg by mouth daily.     No current facility-administered medications for this visit.    PAST MEDICAL HISTORY: Past Medical History:  Diagnosis Date   Benign localized prostatic hyperplasia with lower urinary tract symptoms (LUTS)    Diverticulosis    GERD (gastroesophageal reflux disease)    watch diet   Glaucoma, both eyes    History of adenomatous polyp of colon    History of syncope    per pt has hx of syncopal episodes due to orthostatic hypotension,  last one 04/ 2019 and before that 2017   Hypercholesteremia    Hypertension    Mild intermittent asthma    Personal history of malignant melanoma 1998   s/p  wide local excision from mid lower back ,  per pt sln's negative and no recurrence   Prediabetes    Wears glasses     PAST SURGICAL HISTORY: Past Surgical History:  Procedure Laterality Date   COLONOSCOPY  2011   MELANOMA EXCISION WITH SENTINEL LYMPH NODE BIOPSY  1998   from mid back ,  axillia nodes   TRANSURETHRAL RESECTION OF PROSTATE N/A 07/06/2018   Procedure: TRANSURETHRAL RESECTION OF THE PROSTATE (TURP);  Surgeon: Bjorn Pippin, MD;  Location: [facility];  Service: Urology;  Laterality: N/A;    FAMILY HISTORY: History  reviewed. No pertinent family history.  SOCIAL HISTORY: Social History   Socioeconomic History   Marital status: Married    Spouse name: Not on file   Number of children: Not on file   Years of education: Not on file   Highest education level: Not on file  Occupational History   Not on file  Tobacco Use   Smoking status: Never   Smokeless tobacco: Never  Vaping Use   Vaping Use: Never used  Substance and Sexual Activity   Alcohol use: Yes    Alcohol/week: 0.0 standard drinks of alcohol    Comment: occas.   Drug use: Never   Sexual activity: Not on file    Comment: vasectomy  Other Topics Concern   Not on file  Social History Narrative   Not on file   Social Determinants  of Health   Financial Resource Strain: Not on file  Food Insecurity: Not on file  Transportation Needs: Not on file  Physical Activity: Not on file  Stress: Not on file  Social Connections: Not on file  Intimate Partner Violence: Not on file      Levert Feinstein, M.D. Ph.D.  [facility] [address], [address] [place], [place] [address] Ph: [phone] Fax: [phone]  CC:  Blair Heys, MD [address] [place],  [place] [address]  Blair Heys, MD

## 2022-09-02 ENCOUNTER — Telehealth: Payer: Self-pay | Admitting: Neurology

## 2022-09-02 NOTE — Telephone Encounter (Signed)
Spencer Lloyd: [id number] exp. 09/02/22-10/02/22 sent to GI [phone]

## 2022-09-03 ENCOUNTER — Telehealth: Payer: Self-pay | Admitting: Neurology

## 2022-09-03 NOTE — Telephone Encounter (Signed)
Pt is calling. Asking if Dr. Krista Blue will write him a letter stating she doesn't advise him to travel for the next six months.

## 2022-09-06 NOTE — Telephone Encounter (Signed)
Please advise patient, I do not mind writing them the letter now, He is on schedule for MRI brain for Jan 24th  Would they want to wait for a while, having a stroke does not exclude him from travelling, his symptoms will likely improve if it is from stroke.

## 2022-09-13 DIAGNOSIS — H401133 Primary open-angle glaucoma, bilateral, severe stage: Secondary | ICD-10-CM | POA: Diagnosis not present

## 2022-09-13 DIAGNOSIS — Z961 Presence of intraocular lens: Secondary | ICD-10-CM | POA: Diagnosis not present

## 2022-09-14 ENCOUNTER — Ambulatory Visit: Payer: Medicare PPO | Admitting: Neurology

## 2022-09-14 DIAGNOSIS — R258 Other abnormal involuntary movements: Secondary | ICD-10-CM

## 2022-09-14 DIAGNOSIS — G255 Other chorea: Secondary | ICD-10-CM

## 2022-09-15 ENCOUNTER — Ambulatory Visit
Admission: RE | Admit: 2022-09-15 | Discharge: 2022-09-15 | Disposition: A | Discharge status: Home / Self Care | Payer: Medicare PPO | Source: Ambulatory Visit | Attending: Neurology | Admitting: Neurology

## 2022-09-15 DIAGNOSIS — G255 Other chorea: Secondary | ICD-10-CM | POA: Diagnosis not present

## 2022-09-16 NOTE — Telephone Encounter (Signed)
I called pt and updated on MRI results. He verbalized understanding and appreciation and states symptoms are stable.   He did request the results of his EEG once available.   I have formulated letter that states he should not travel for the next 6 months. Will placed upfront once Dr. Krista Blue signs.

## 2022-09-16 NOTE — Telephone Encounter (Signed)
Please call patient, MRI of the brain showed no acute abnormality  Also check on his symptoms to see if he continues to improve, if not, I will order more laboratory evaluations to rule out other metabolic reasons  If he still want the letter of excuse to cancel his travel plan, we can do so   IMPRESSION: Unremarkable MRI scan of the brain without contrast showing only minor changes of small vessel disease and supratentorial cortical atrophy.

## 2022-09-20 ENCOUNTER — Encounter: Payer: Self-pay | Admitting: Neurology

## 2022-09-21 ENCOUNTER — Telehealth: Payer: Self-pay | Admitting: *Deleted

## 2022-09-21 NOTE — Telephone Encounter (Signed)
Pt form statement form @ front desk for p/u

## 2022-09-22 NOTE — Procedures (Signed)
   HISTORY: 77 year old male with sudden onset left hemiballism  TECHNIQUE:  This is a routine 16 channel EEG recording with one channel devoted to a limited EKG recording.  It was performed during wakefulness, drowsiness and asleep.  Hyperventilation and photic stimulation were performed as activating procedures.  There are minimum muscle and movement artifact noted.  Upon maximum arousal, posterior dominant waking rhythm consistent of rhythmic alpha range activity. Activities are symmetric over the bilateral posterior derivations and attenuated with eye opening.  Hyperventilation produced mild/moderate buildup with higher amplitude and the slower activities noted.  Photic stimulation did not alter the tracing.  During EEG recording, patient developed drowsiness and no deeper stage of sleep was achieved  During EEG recording, there was no epileptiform discharge noted.  EKG demonstrate normal sinus rhythm.  CONCLUSION: This is a  normal awake EEG.  There is no electrodiagnostic evidence of epileptiform discharge.  Marcial Pacas, M.D. Ph.D.  [facility] [place], [place] [address] Phone: [phone] Fax:      [phone]

## 2022-09-23 ENCOUNTER — Ambulatory Visit (HOSPITAL_BASED_OUTPATIENT_CLINIC_OR_DEPARTMENT_OTHER)
Admission: RE | Admit: 2022-09-23 | Discharge: 2022-09-23 | Disposition: A | Discharge status: Home / Self Care | Payer: Medicare PPO | Source: Ambulatory Visit | Attending: Neurology | Admitting: Neurology

## 2022-09-23 ENCOUNTER — Ambulatory Visit (HOSPITAL_COMMUNITY)
Admission: RE | Admit: 2022-09-23 | Discharge: 2022-09-23 | Disposition: A | Discharge status: Home / Self Care | Payer: Medicare PPO | Source: Ambulatory Visit | Attending: Neurology | Admitting: Neurology

## 2022-09-23 DIAGNOSIS — E785 Hyperlipidemia, unspecified: Secondary | ICD-10-CM | POA: Diagnosis not present

## 2022-09-23 DIAGNOSIS — K219 Gastro-esophageal reflux disease without esophagitis: Secondary | ICD-10-CM | POA: Insufficient documentation

## 2022-09-23 DIAGNOSIS — I517 Cardiomegaly: Secondary | ICD-10-CM | POA: Insufficient documentation

## 2022-09-23 DIAGNOSIS — I10 Essential (primary) hypertension: Secondary | ICD-10-CM | POA: Diagnosis not present

## 2022-09-23 DIAGNOSIS — G255 Other chorea: Secondary | ICD-10-CM

## 2022-09-23 LAB — ECHOCARDIOGRAM COMPLETE
Area-P 1/2: 3.56 cm2
S' Lateral: 2.7 cm

## 2022-09-23 NOTE — Progress Notes (Signed)
  Echocardiogram 2D Echocardiogram has been performed.  Spencer Lloyd M 09/23/2022, 9:33 AM

## 2022-09-23 NOTE — Progress Notes (Signed)
VASCULAR LAB    Carotid duplex has been performed.  See CV proc for preliminary results.   Sahaj Bona, RVT 09/23/2022, 10:23 AM

## 2022-09-23 NOTE — Progress Notes (Signed)
Chief Complaint  Patient presents with   Follow-up    Patient in room #8 with his wife. Pt states he doesn't have any concern for his f/u today.    ASSESSMENT AND PLAN  Spencer Lincks. is a 77 y.o. 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  MRI brain wo contrast 09/15/2022 IMPRESSION: Unremarkable MRI scan of the brain without contrast showing only minor changes of small vessel disease and supratentorial cortical atrophy.   ECHOCARDIOGRAM 09/23/2022 IMPRESSIONS   1. Left ventricular ejection fraction, by estimation, is 60 to 65%. Left  ventricular ejection fraction by 3D volume is 66 %. The left ventricle has  normal function. The left ventricle has no regional wall motion  abnormalities. There is mild asymmetric  left ventricular hypertrophy of the basal-septal segment. Left ventricular  diastolic parameters were normal. The average left ventricular global  longitudinal strain is -16.3 %. The global longitudinal strain is normal.   2. Right ventricular systolic function is normal. The right ventricular  size is normal.   3. Left atrial size was mildly dilated.   4. The mitral valve is abnormal. Trivial mitral valve regurgitation. No  evidence of mitral stenosis.   5. The aortic valve is tricuspid. There is mild calcification of the  aortic valve. There is mild thickening of the aortic valve.  Aortic valve  regurgitation is not visualized. Aortic valve sclerosis is present, with  no evidence of aortic valve stenosis.   6. The inferior vena cava is normal in size with greater than 50%  respiratory variability, suggesting right atrial pressure of 3 mmHg.   VAS US CAROTID DUPLEX BILATERAL 09/23/2022 Summary:  Right Carotid: The extracranial vessels were near-normal with only minimal wall thickening or plaque.  Left Carotid: The extracranial vessels were near-normal with only minimal wall thickening or plaque.  Vertebrals:  Bilateral vertebral arteries demonstrate antegrade flow.  Subclavians: Normal flow hemodynamics were seen in bilateral subclavian arteries.      MEDICAL HISTORY:  Update 09/27/2022 Spencer Lloyd: Returns for 4-week follow-up accompanied by his wife.    He has not had any additional episodes since prior visit.  Denies any residual left-sided symptoms.  Continues on aspirin and pravastatin Has repeat lab work next month with PCP BP can fluctuate at home, today 143/73  MRI brain largely unremarkable with only minor changes of small vessel disease and cortical atrophy.  EEG normal 2D echo EF 60 to 65% Carotid ultrasound unremarkable     History provided for reference purposes only Consult visit 08/31/2022 Dr. Krista Blue: Spencer Royals Skyler Carel. is a 77 year old male, seen in request by his primary care physician from [facility] E Dr. Marisue Humble, Herbie [place] for evaluation of sudden onset left side abnormal movement, initial evaluation was on August 31, 2022    I reviewed and summarized the referring note. PMHX HTN HLD GERD Melanoma in 1998, BPH.  On August 17, 2022, he and his wife  went down to her normal driving trip, couple hours into driving, he noticed sudden onset left foot uncontrollable movement,  Noticed obvious left arm involvement by December 27, it has been persistent since then, sometimes worse than the others, he reported 1 episode on December 31, while eating at [employer], he had such uncontrollable left arm and leg movements, leading to emergency room visit at [facility] reviewed CT head without contrast, no acute intracranial abnormality  CT cervical spine showed diffuse degenerative changes, laboratory evaluations normal CMP, CBC,  I was able to watch 1 video clip, when he has more large amplitude uncontrollable left arm and left leg movement, most consistent with left hemiballism, during today's interview, he also has frequent but much lesser amplitude of uncontrollable left arm and leg sudden jerking movement     PHYSICAL EXAM:   Vitals:   09/27/22 0930  BP: (!) 143/73  Pulse: (!) 54  Weight: 164 lb (74.4 kg)  Height: '5\' 7"'$  (1.702 m)   Body mass index is 25.69 kg/m.  PHYSICAL EXAMNIATION:  Gen: NAD, conversant, very pleasant elderly Caucasian male, well nourised, well groomed                     Cardiovascular: Regular rate rhythm, no peripheral edema, warm, nontender. Eyes: Conjunctivae clear without exudates or hemorrhage Neck: Supple, no carotid bruits. Pulmonary: Clear to auscultation bilaterally   NEUROLOGICAL EXAM:  MENTAL STATUS: Speech/cognition: Awake, alert, oriented to history taking and casual conversation CRANIAL NERVES: CN II: Visual fields are full to confrontation. Pupils are round equal and briskly reactive to light. CN III, IV, VI: extraocular movement are normal. No ptosis. CN V: Facial sensation is intact to light touch CN VII: Face is symmetric with normal eye closure  CN VIII: Hearing is normal to causal conversation. CN IX, X: Phonation is normal. CN XI: Head turning and shoulder shrug are intact  MOTOR: Normal strength, bulk and tone in all tested extremities except as mildly orbit right arm over left arm.  No evidence of drift or jerking movements on exam today  REFLEXES: Reflexes are 2+ and symmetric at the biceps, triceps, knees, and ankles. Plantar responses are  flexor.  SENSORY: Intact to light touch, pinprick and vibratory sensation are intact in fingers and toes.  COORDINATION: There is no trunk or limb dysmetria noted.  GAIT/STANCE: Need push-up to get up from seated position, normal stride length and step height.  Able to heel toe and tandem walk without difficulty    REVIEW OF SYSTEMS:  Full 14 system review of systems performed and notable only for as above All other review of systems were negative.   ALLERGIES: No Known Allergies  HOME MEDICATIONS: Current Outpatient Medications  Medication Sig Dispense Refill   albuterol (VENTOLIN HFA) 108 (90 Base) MCG/ACT inhaler INHALE 2 PUFFS BY MOUTH EVERY 4 HOURS ASNEEDED FOR WHEEZING OR SHORTNESS OF BREATH 8.5 g 2   ALPHAGAN P 0.1 % SOLN Place 1 drop into both eyes 2 (two) times daily.      amLODipine (NORVASC) 5 MG tablet Take 5 mg by mouth at bedtime.      aspirin EC 81 MG tablet Take 81 mg by mouth daily. Swallow whole.     Cetirizine HCl (ZYRTEC PO) Take by mouth.     dorzolamide-timolol (COSOPT) 22.3-6.8 MG/ML ophthalmic solution Place 1 drop into both eyes 2 (two) times daily.      famotidine (PEPCID) 20 MG tablet 1 tablet  gabapentin (NEURONTIN) 300 MG capsule Take 1 capsule (300 mg total) by mouth 3 (three) times daily. (Patient taking differently: Take 300 mg by mouth as needed.) 100 capsule 0   latanoprost (XALATAN) 0.005 % ophthalmic solution      montelukast (SINGULAIR) 10 MG tablet Take 1 tablet (10 mg total) by mouth at bedtime. 30 tablet 5   pravastatin (PRAVACHOL) 40 MG tablet Take 40 mg by mouth daily.     SYMBICORT 80-4.5 MCG/ACT inhaler Inhale 2 puffs into the lungs 2 (two) times daily. 1 each 6   valsartan (DIOVAN) 80 MG tablet Take 80 mg by mouth daily.     No current facility-administered medications for this visit.    PAST MEDICAL HISTORY: Past Medical History:  Diagnosis Date   Benign localized prostatic hyperplasia with lower urinary tract symptoms (LUTS)     Diverticulosis    GERD (gastroesophageal reflux disease)    watch diet   Glaucoma, both eyes    History of adenomatous polyp of colon    History of syncope    per pt has hx of syncopal episodes due to orthostatic hypotension,  last one 04/ 2019 and before that 2017   Hypercholesteremia    Hypertension    Mild intermittent asthma    Personal history of malignant melanoma 1998   s/p  wide local excision from mid lower back ,  per pt sln's negative and no recurrence   Prediabetes    Wears glasses     PAST SURGICAL HISTORY: Past Surgical History:  Procedure Laterality Date   COLONOSCOPY  2011   MELANOMA EXCISION WITH SENTINEL LYMPH NODE BIOPSY  1998   from mid back ,  axillia nodes   TRANSURETHRAL RESECTION OF PROSTATE N/A 07/06/2018   Procedure: TRANSURETHRAL RESECTION OF THE PROSTATE (TURP);  Surgeon: Irine Seal, MD;  Location: [facility];  Service: Urology;  Laterality: N/A;    FAMILY HISTORY: History reviewed. No pertinent family history.  SOCIAL HISTORY: Social History   Socioeconomic History   Marital status: Married    Spouse name: Not on file   Number of children: Not on file   Years of education: Not on file   Highest education level: Not on file  Occupational History   Not on file  Tobacco Use   Smoking status: Never   Smokeless tobacco: Never  Vaping Use   Vaping Use: Never used  Substance and Sexual Activity   Alcohol use: Yes    Alcohol/week: 0.0 standard drinks of alcohol    Comment: occas.   Drug use: Never   Sexual activity: Not on file    Comment: vasectomy  Other Topics Concern   Not on file  Social History Narrative   Not on file   Social Determinants of Health   Financial Resource Strain: Not on file  Food Insecurity: Not on file  Transportation Needs: Not on file  Physical Activity: Not on file  Stress: Not on file  Social Connections: Not on file  Intimate Partner Violence: Not on file      I spent 34 minutes of face-to-face and  non-face-to-face time with patient and wife.  This included previsit chart review, lab review, study review, order entry, electronic health record documentation, patient and wife education and discussion regarding above diagnoses and treatment plan and answered all the questions to patient wife satisfaction  Frann Rider, [facility]  [facility] [address] [place], [place] [address]  Phone [phone] Fax [phone] Note: This document  was prepared with digital dictation and possible smart phrase technology. Any transcriptional errors that result from this process are unintentional.

## 2022-09-27 ENCOUNTER — Ambulatory Visit: Payer: Medicare PPO | Admitting: Adult Health

## 2022-09-27 ENCOUNTER — Encounter: Payer: Self-pay | Admitting: Adult Health

## 2022-09-27 VITALS — BP 143/73 | HR 54 | Ht 67.0 in | Wt 164.0 lb

## 2022-09-27 DIAGNOSIS — G255 Other chorea: Secondary | ICD-10-CM

## 2022-09-27 NOTE — Patient Instructions (Addendum)
Your Plan:  Suspect your symptoms are possibly from a stroke not seen on imaging.  Continue aspirin and pravastatin for secondary stroke prevention   Please call or send MyChart with any additional episodes       Thank you for coming to see [place] at [facility]. I hope we have been able to provide you high quality care today.  You may receive a patient satisfaction survey over the next few weeks. We would appreciate your feedback and comments so that we may continue to improve ourselves and the health of our patients.

## 2022-09-28 ENCOUNTER — Ambulatory Visit: Payer: Medicare PPO | Admitting: Neurology

## 2022-09-29 ENCOUNTER — Encounter: Payer: Self-pay | Admitting: Adult Health

## 2022-09-30 NOTE — Telephone Encounter (Signed)
Jinny Blossom, I know you completed this form on Monday after I saw him, do you know if Hilda Blades received this? Thank you!

## 2022-10-18 NOTE — Progress Notes (Signed)
Chart reviewed, agree above plan ?

## 2022-11-02 ENCOUNTER — Telehealth: Payer: Self-pay | Admitting: Adult Health

## 2022-11-02 MED ORDER — MONTELUKAST SODIUM 10 MG PO TABS: 10.0000 mg | ORAL_TABLET | Freq: Every day | ORAL | 5 refills | Status: DC

## 2022-11-02 NOTE — Telephone Encounter (Signed)
Pt. Calling needs montelukast (SINGULAIR)  sent to his pharmacy he is out and said pharmacy has been trying to fax [place]

## 2022-11-02 NOTE — Telephone Encounter (Signed)
Called and spoke with patient. He is requesting a refill on his montelukast '10mg'$  to be sent to [facility]. I advised him I would go ahead and send this in for him. He verbalized understanding.   RX has been sent.   Nothing further needed at time of call.

## 2022-11-15 DIAGNOSIS — Z0000 Encounter for general adult medical examination without abnormal findings: Secondary | ICD-10-CM | POA: Diagnosis not present

## 2022-11-15 DIAGNOSIS — Z85820 Personal history of malignant melanoma of skin: Secondary | ICD-10-CM | POA: Diagnosis not present

## 2022-11-15 DIAGNOSIS — R7303 Prediabetes: Secondary | ICD-10-CM | POA: Diagnosis not present

## 2022-11-15 DIAGNOSIS — E7800 Pure hypercholesterolemia, unspecified: Secondary | ICD-10-CM | POA: Diagnosis not present

## 2022-11-15 DIAGNOSIS — I10 Essential (primary) hypertension: Secondary | ICD-10-CM | POA: Diagnosis not present

## 2022-11-15 DIAGNOSIS — J4520 Mild intermittent asthma, uncomplicated: Secondary | ICD-10-CM | POA: Diagnosis not present

## 2022-11-15 DIAGNOSIS — H409 Unspecified glaucoma: Secondary | ICD-10-CM | POA: Diagnosis not present

## 2022-11-15 DIAGNOSIS — Z1331 Encounter for screening for depression: Secondary | ICD-10-CM | POA: Diagnosis not present

## 2022-11-15 DIAGNOSIS — Z85828 Personal history of other malignant neoplasm of skin: Secondary | ICD-10-CM | POA: Diagnosis not present

## 2022-11-19 IMAGING — DX DG CHEST 2V
2 series · 2 of 2 positions shown · non-contrast
Comparison: April 21, 2020

CLINICAL DATA: Shortness of breath.

EXAM:
CHEST - 2 VIEW

[chest pa]
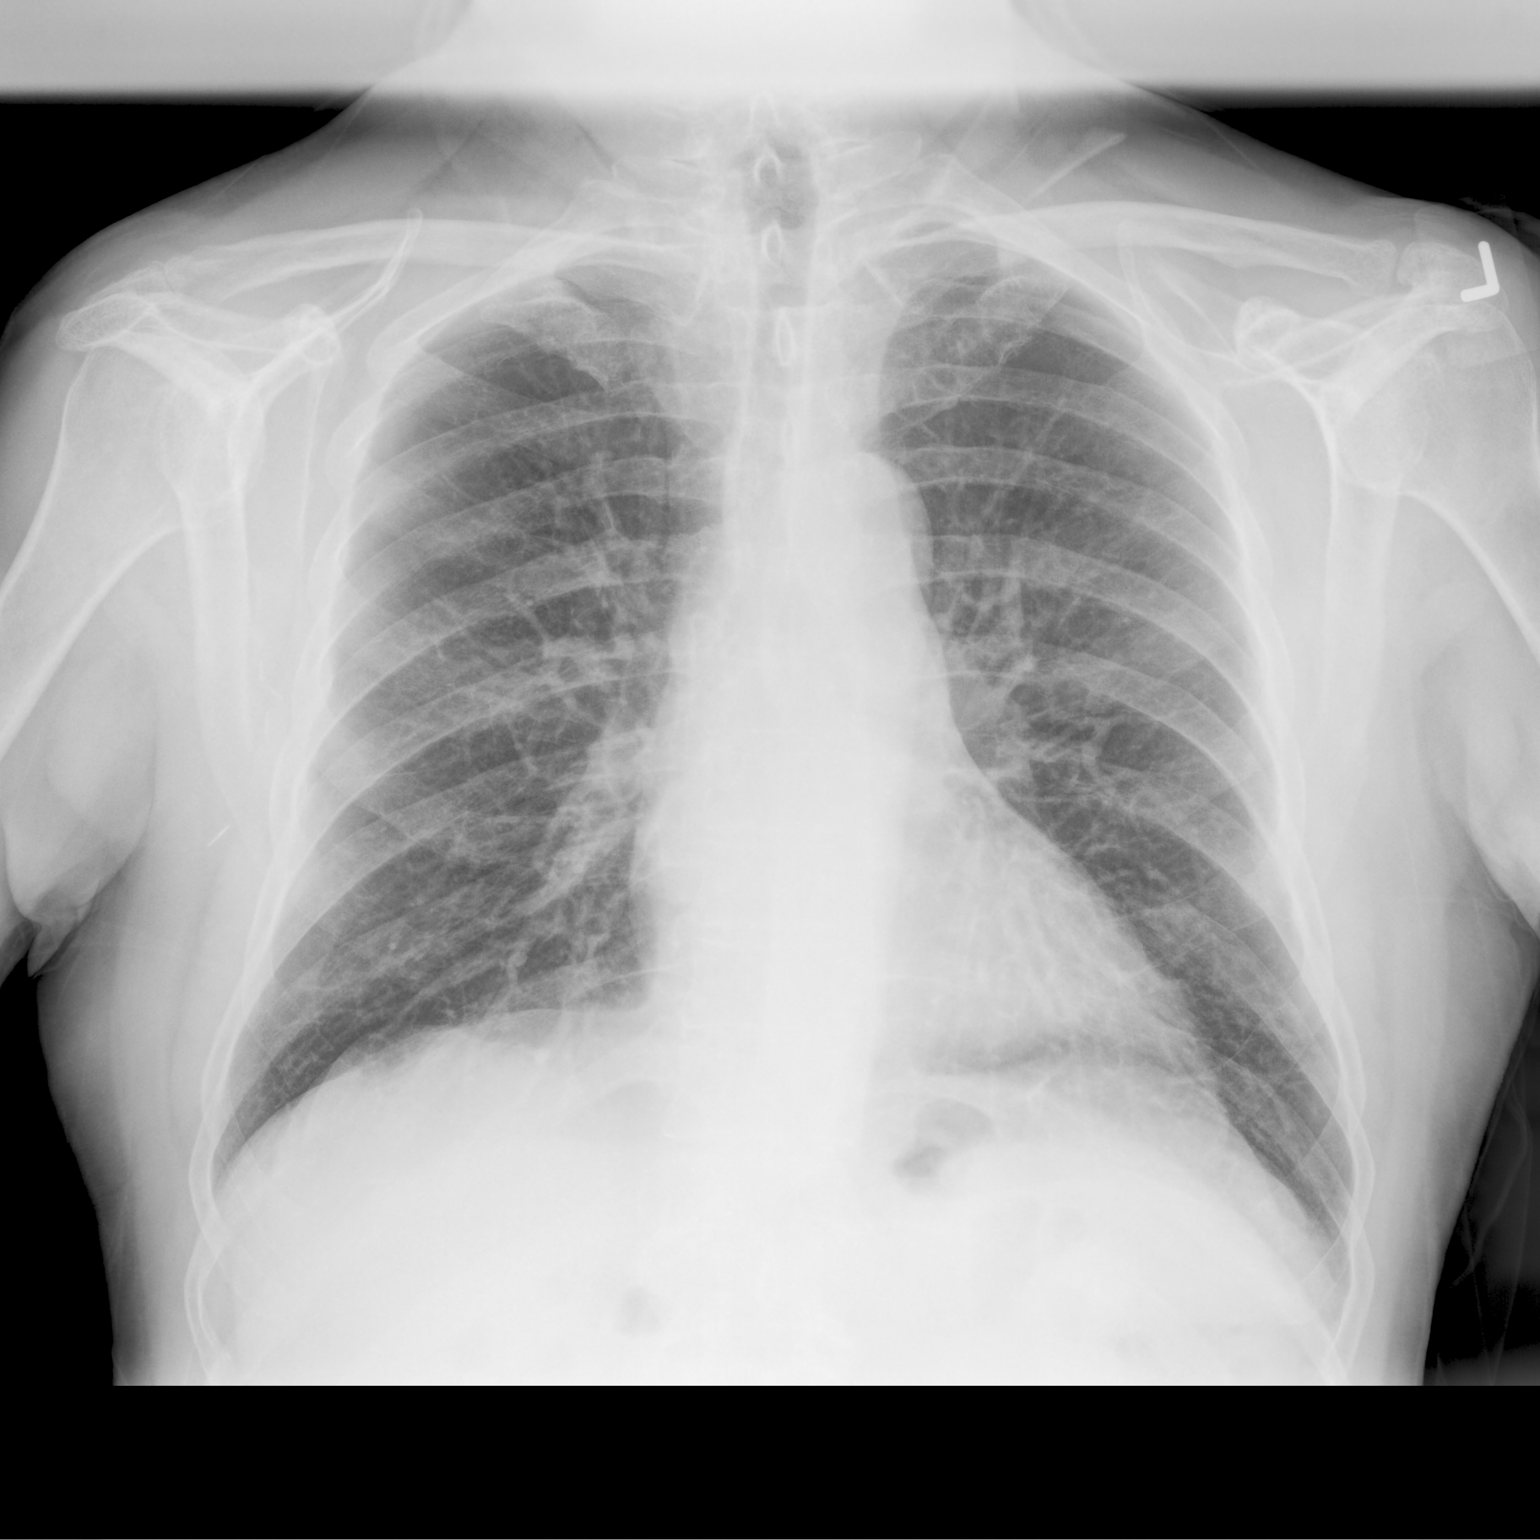

[chest lat]
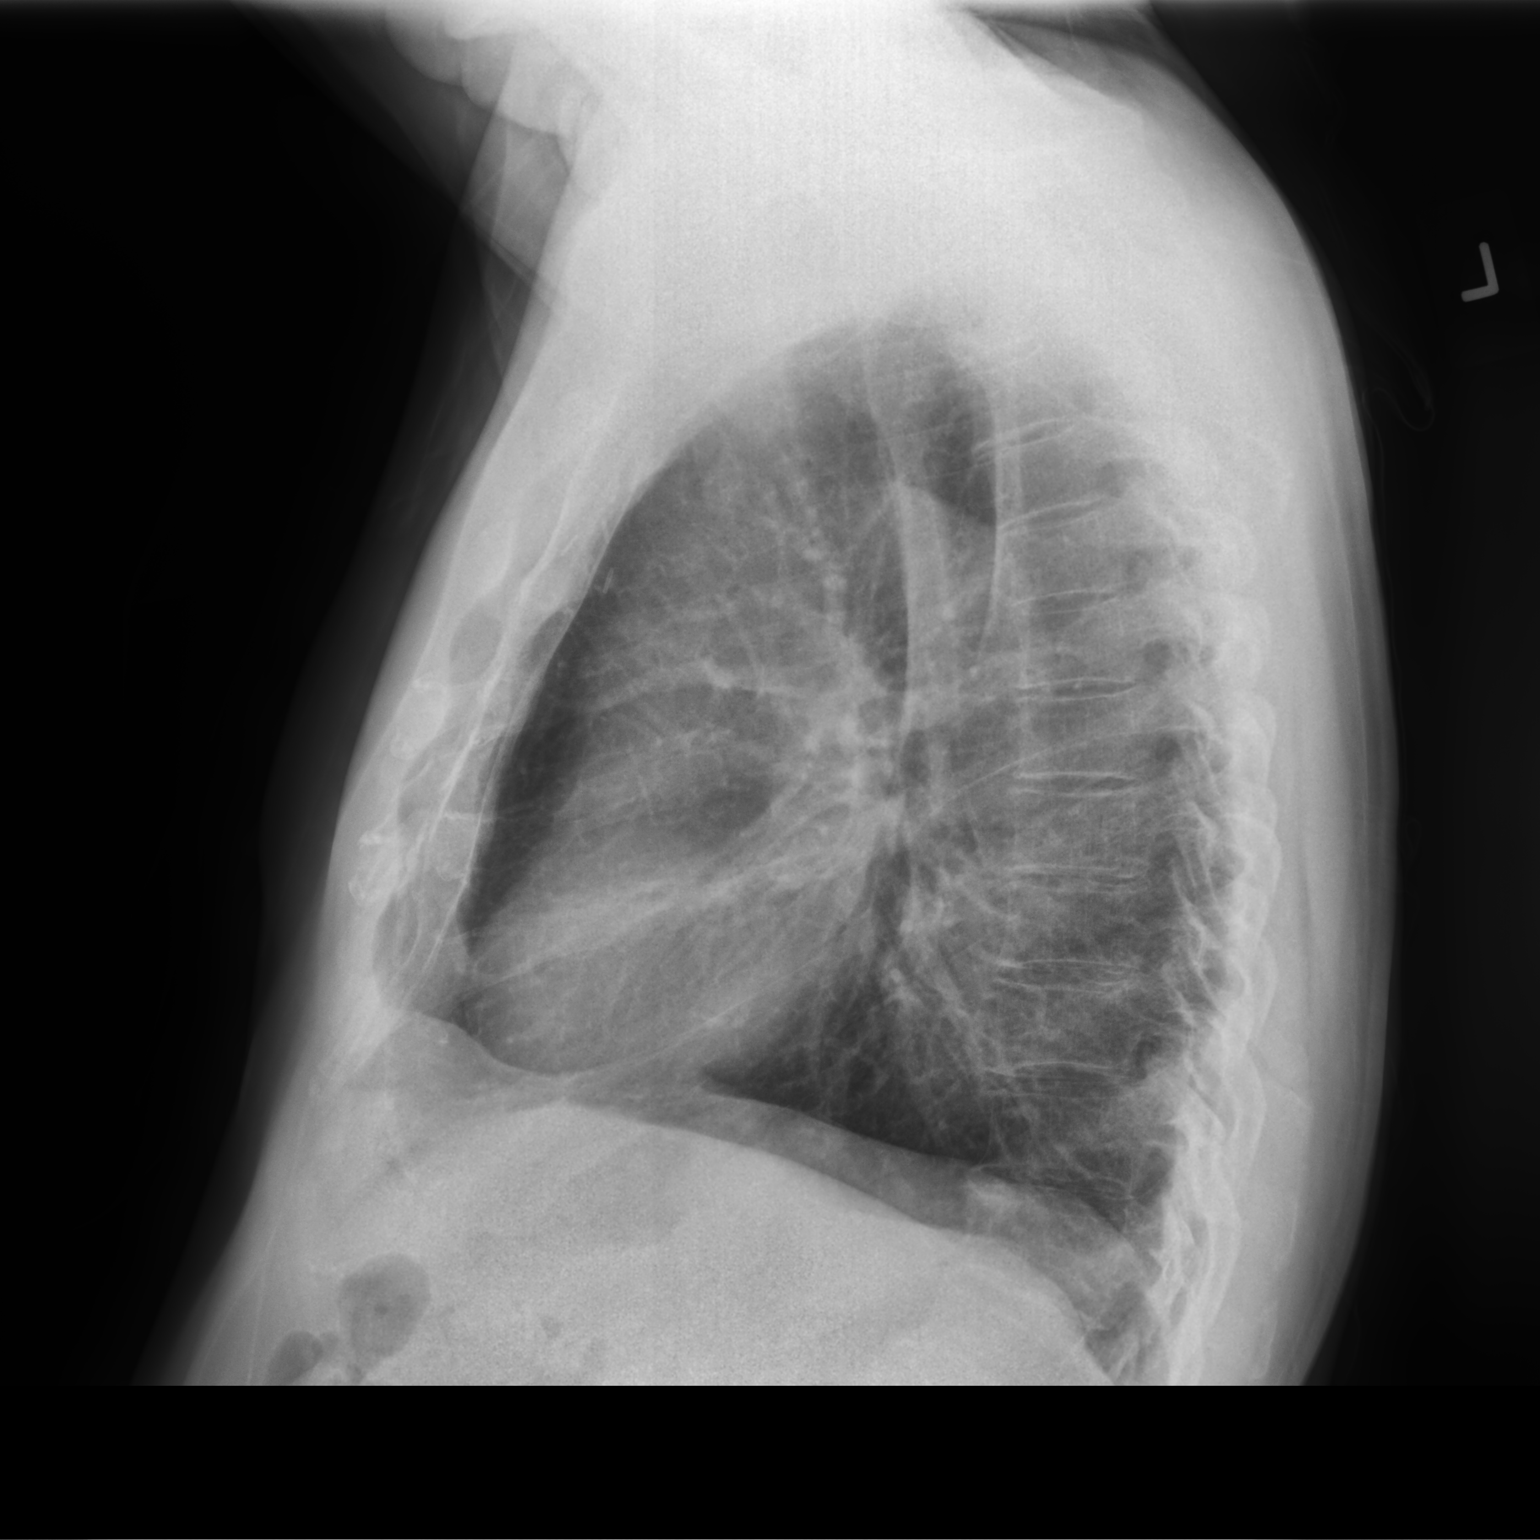

[2 of 2 positions shown; findings below may reference images not displayed]

FINDINGS: The heart size and mediastinal contours are within normal limits.
Both lungs are clear. The visualized skeletal structures are
unremarkable.
IMPRESSION: No active cardiopulmonary disease.

## 2022-11-25 ENCOUNTER — Ambulatory Visit: Payer: Medicare PPO | Admitting: Emergency Medicine

## 2022-11-25 ENCOUNTER — Encounter: Payer: Self-pay | Admitting: Emergency Medicine

## 2022-11-25 VITALS — BP 136/78 | HR 55 | Temp 98.5°F | Ht 67.0 in | Wt 163.2 lb

## 2022-11-25 DIAGNOSIS — J4530 Mild persistent asthma, uncomplicated: Secondary | ICD-10-CM | POA: Diagnosis not present

## 2022-11-25 DIAGNOSIS — J302 Other seasonal allergic rhinitis: Secondary | ICD-10-CM

## 2022-11-25 DIAGNOSIS — K219 Gastro-esophageal reflux disease without esophagitis: Secondary | ICD-10-CM | POA: Diagnosis not present

## 2022-11-25 NOTE — Assessment & Plan Note (Signed)
Pepcid daily

## 2022-11-25 NOTE — Assessment & Plan Note (Signed)
Continue cetirizine, Singulair

## 2022-11-25 NOTE — Patient Instructions (Signed)
Please continue Symbicort 2 puffs twice daily.  Rinse and gargle after using Keep your albuterol available to use 2 puffs when needed for shortness of breath, chest tightness, wheezing. Continue Singulair 10 mg each evening Continue your cetirizine (generic Zyrtec) once daily Continue Pepcid once daily Keep your flu shot and your COVID-19 vaccine up-to-date in the fall Agree with getting the RSV vaccine Follow with K. Cobb in 6 months Please call us if you have any respiratory issues or any flaring symptoms. Follow Dr. Lamonte Sakai in 12 months or sooner if you have any problems.

## 2022-11-25 NOTE — Progress Notes (Signed)
   Subjective:    Patient ID: Spencer Ram., male    DOB: 1946/02/13, 77 y.o.   MRN: [id number]  HPI  ROV 02/03/22 --follow-up visit 77 year old gentleman with a history of mild intermittent asthma and chronic cough in the setting of chronic rhinitis and GERD.  He does have mild obstruction on PFT.  He was doing well but had an asthma flare in May, was treated with prednisone and started on Symbicort.  Unclear trigger beyond possibly allergic rhinitis.  The flare was characterized by wheeze, nocturnal awakenings, SOB. Today he reports that he quickly improved  He is managed on Symbicort, loratadine, Pepcid.  He is using albuterol rarely now. The albuterol does not seem to help much even when he is flaring.   12/23/21 >> IgE 81, eos% 6.3, abs 0.4  ROV 11/25/22  --Spencer Lloyd is 77 and has a story of mild intermittent asthma, chronic cough, chronic rhinitis, GERD.  He has mild obstruction on his pulmonary function testing. Currently managed on Symbicort 80, Singulair, Zyrtec, Pepcid once daily.  Uses albuterol Today he reports that he is doing well. He was treated with pred tapers x 3 last year, last time was 04/2022. He is back on Symbicort and is benefiting, also started on Singulair. Never needs his albuterol unless he is flaring.   Review of Systems As per HPI     Objective:   Physical Exam Vitals:   11/25/22 1544  BP: 136/78  Pulse: (!) 55  Temp: 98.5 F (36.9 C)  TempSrc: Oral  SpO2: 99%  Weight: 163 lb 3.2 oz (74 kg)  Height: 5\' 7"  (1.702 m)   Gen: Pleasant, well-nourished, in no distress,  normal affect  ENT: No lesions,  mouth clear,  oropharynx clear, no postnasal drip, gravelly voice  Neck: No JVD, no stridor  Lungs: No use of accessory muscles, no crackles.  Focal expiratory wheeze left midlung  Cardiovascular: RRR, heart sounds normal, no murmur or gallops, no peripheral edema  Musculoskeletal: No deformities, no cyanosis or clubbing  Neuro: alert, awake, non  focal  Skin: Warm, no lesions or rash     Assessment & Plan:  Mild persistent asthma He flared in September, is now improved.  He has benefited from restarting Symbicort.  He has been using it through a spacer so less impact on upper airway irritation.  Plan to continue this, Singulair (which was also new), albuterol as needed.  Discussed getting the flu shot, COVID-19 shot, RSV shot.  GERD (gastroesophageal reflux disease) Pepcid daily  Seasonal allergies Continue cetirizine, Singulair  Baltazar Apo, MD, PhD 11/25/2022, 4:18 PM [facility] [phone] or if no answer [phone]

## 2022-11-25 NOTE — Assessment & Plan Note (Signed)
He flared in September, is now improved.  He has benefited from restarting Symbicort.  He has been using it through a spacer so less impact on upper airway irritation.  Plan to continue this, Singulair (which was also new), albuterol as needed.  Discussed getting the flu shot, COVID-19 shot, RSV shot.

## 2022-12-01 ENCOUNTER — Ambulatory Visit: Payer: Medicare PPO | Admitting: Nurse Practitioner

## 2022-12-20 DIAGNOSIS — H401133 Primary open-angle glaucoma, bilateral, severe stage: Secondary | ICD-10-CM | POA: Diagnosis not present

## 2022-12-20 DIAGNOSIS — Z961 Presence of intraocular lens: Secondary | ICD-10-CM | POA: Diagnosis not present

## 2022-12-22 DIAGNOSIS — D1801 Hemangioma of skin and subcutaneous tissue: Secondary | ICD-10-CM | POA: Diagnosis not present

## 2022-12-22 DIAGNOSIS — L905 Scar conditions and fibrosis of skin: Secondary | ICD-10-CM | POA: Diagnosis not present

## 2022-12-22 DIAGNOSIS — D0439 Carcinoma in situ of skin of other parts of face: Secondary | ICD-10-CM | POA: Diagnosis not present

## 2022-12-22 DIAGNOSIS — L821 Other seborrheic keratosis: Secondary | ICD-10-CM | POA: Diagnosis not present

## 2022-12-22 DIAGNOSIS — D1721 Benign lipomatous neoplasm of skin and subcutaneous tissue of right arm: Secondary | ICD-10-CM | POA: Diagnosis not present

## 2022-12-22 DIAGNOSIS — L820 Inflamed seborrheic keratosis: Secondary | ICD-10-CM | POA: Diagnosis not present

## 2022-12-22 DIAGNOSIS — Z85828 Personal history of other malignant neoplasm of skin: Secondary | ICD-10-CM | POA: Diagnosis not present

## 2022-12-22 DIAGNOSIS — L570 Actinic keratosis: Secondary | ICD-10-CM | POA: Diagnosis not present

## 2022-12-22 DIAGNOSIS — D485 Neoplasm of uncertain behavior of skin: Secondary | ICD-10-CM | POA: Diagnosis not present

## 2022-12-22 DIAGNOSIS — Z85820 Personal history of malignant melanoma of skin: Secondary | ICD-10-CM | POA: Diagnosis not present

## 2023-02-16 DIAGNOSIS — E7800 Pure hypercholesterolemia, unspecified: Secondary | ICD-10-CM | POA: Diagnosis not present

## 2023-02-16 DIAGNOSIS — Z79899 Other long term (current) drug therapy: Secondary | ICD-10-CM | POA: Diagnosis not present

## 2023-03-07 DIAGNOSIS — H9319 Tinnitus, unspecified ear: Secondary | ICD-10-CM | POA: Diagnosis not present

## 2023-03-07 DIAGNOSIS — H6123 Impacted cerumen, bilateral: Secondary | ICD-10-CM | POA: Diagnosis not present

## 2023-03-21 DIAGNOSIS — H9313 Tinnitus, bilateral: Secondary | ICD-10-CM | POA: Diagnosis not present

## 2023-03-21 DIAGNOSIS — H903 Sensorineural hearing loss, bilateral: Secondary | ICD-10-CM | POA: Diagnosis not present

## 2023-03-22 DIAGNOSIS — H401133 Primary open-angle glaucoma, bilateral, severe stage: Secondary | ICD-10-CM | POA: Diagnosis not present

## 2023-03-22 DIAGNOSIS — Z961 Presence of intraocular lens: Secondary | ICD-10-CM | POA: Diagnosis not present

## 2023-04-06 ENCOUNTER — Telehealth: Payer: Self-pay | Admitting: Emergency Medicine

## 2023-04-06 NOTE — Telephone Encounter (Signed)
Patient states needs refill for Symbicort. Pharmacy is [facility]. Patent phone number is [phone].

## 2023-04-08 ENCOUNTER — Other Ambulatory Visit: Payer: Self-pay | Admitting: Nurse Practitioner

## 2023-04-08 DIAGNOSIS — J4530 Mild persistent asthma, uncomplicated: Secondary | ICD-10-CM

## 2023-04-08 NOTE — Telephone Encounter (Signed)
Patient is calling for an update on his prescription refill of Symbicort . He is completely out of it and would like it refilled.[phone]. HE states that his pharmacy,[facility], has faxed over prescription refill form Tuesday but they have yet to receive anything back.

## 2023-04-08 NOTE — Telephone Encounter (Signed)
Symbicort has been sent to pharmacy. Patient has been made aware through my chart. Closing encounter

## 2023-05-18 DIAGNOSIS — I10 Essential (primary) hypertension: Secondary | ICD-10-CM | POA: Diagnosis not present

## 2023-05-18 DIAGNOSIS — R7303 Prediabetes: Secondary | ICD-10-CM | POA: Diagnosis not present

## 2023-05-18 DIAGNOSIS — Z85828 Personal history of other malignant neoplasm of skin: Secondary | ICD-10-CM | POA: Diagnosis not present

## 2023-05-18 DIAGNOSIS — H409 Unspecified glaucoma: Secondary | ICD-10-CM | POA: Diagnosis not present

## 2023-05-18 DIAGNOSIS — H9319 Tinnitus, unspecified ear: Secondary | ICD-10-CM | POA: Diagnosis not present

## 2023-05-18 DIAGNOSIS — J4520 Mild intermittent asthma, uncomplicated: Secondary | ICD-10-CM | POA: Diagnosis not present

## 2023-05-18 DIAGNOSIS — K219 Gastro-esophageal reflux disease without esophagitis: Secondary | ICD-10-CM | POA: Diagnosis not present

## 2023-05-18 DIAGNOSIS — Z85820 Personal history of malignant melanoma of skin: Secondary | ICD-10-CM | POA: Diagnosis not present

## 2023-05-18 DIAGNOSIS — E7800 Pure hypercholesterolemia, unspecified: Secondary | ICD-10-CM | POA: Diagnosis not present

## 2023-05-24 ENCOUNTER — Ambulatory Visit: Payer: Medicare PPO | Admitting: Nurse Practitioner

## 2023-05-24 ENCOUNTER — Encounter: Payer: Self-pay | Admitting: Nurse Practitioner

## 2023-05-24 VITALS — BP 130/90 | HR 53 | Ht 67.0 in | Wt 159.4 lb

## 2023-05-24 DIAGNOSIS — J4530 Mild persistent asthma, uncomplicated: Secondary | ICD-10-CM | POA: Diagnosis not present

## 2023-05-24 DIAGNOSIS — J302 Other seasonal allergic rhinitis: Secondary | ICD-10-CM | POA: Diagnosis not present

## 2023-05-24 DIAGNOSIS — K219 Gastro-esophageal reflux disease without esophagitis: Secondary | ICD-10-CM

## 2023-05-24 MED ORDER — MONTELUKAST SODIUM 10 MG PO TABS: 10.0000 mg | ORAL_TABLET | Freq: Every day | ORAL | 3 refills | Status: DC

## 2023-05-24 NOTE — Progress Notes (Signed)
@Patient  ID: Spencer Sartorius., male    DOB: [birth year], 77 y.o.   MRN: [id number]  Chief Complaint  Patient presents with   Follow-up    Pt is here for Asthma F/U visit. Pt states he has no problems and no concerns.    Referring provider: Blair Heys, MD  HPI: 77 year old male, never smoker followed for asthma and upper airway cough syndrome.  He is a patient Dr. Kavin Leech and was last seen in office on 11/25/2022.  Past medical history significant for hypertension, GERD, BPH, HLD, prediabetes.  TEST/EVENTS:  06/04/2020 PFTs: FVC 95, FEV1 84, ratio 62, TLC 91, DLCOcor 103%.  No BD.  Mild obstructive airway disease without reversibility and normal diffusion capacity. 10/28/2021 CXR 2 view: Both lungs were clear without any evidence of acute process 12/23/2021 FeNO 43 ppb; absolute eos 400  10/28/2021: OV with Dr. Delton Coombes.  Worsening DOE, wheeze and cough over the past several months.  Started back on Pepcid; paradoxical response to PPI in past and did not want to retry.  Felt allergic rhinitis well-controlled on current allergy regimen.  CXR without acute process no evidence of significant bronchospasm on exam.  Treated for upper irritation with pred taper.  11/11/2021: OV with Turon Kilmer NP for follow-up.  Reported feeling much better.  Breathing was stable and had not noticed any further wheezing.  Cough was resolved.  He was concerned going into spring that his symptoms might flareup again as he feels like his symptoms always get worse with springtime and working in the yard.  Advised to monitor his symptoms and notify us if worsening occurs.  Continue on as needed albuterol, Claritin and Pepcid.  12/23/2021: OV with Jessicca Stitzer NP for asthma flare.  He has had some increased shortness of breath, wheezing and coughing over the last few weeks.  Shortness of breath is primarily upon exertion.  Cough is primarily dry and paroxysmal at times; usually happens when he starts becoming short of breath.  He has been  having to use his rescue inhaler multiple times a day.  Has been waking up frequently throughout the week with wheezing and increased shortness of breath as well.  He feels like his allergy symptoms are well-controlled on current regimen. No breakthrough GERD symptoms. Denies recent sick exposures, fevers, hemoptysis, weight loss, anorexia, or leg swelling. FeNO 43 ppb. Treated with prednisone taper and started on ICS/LABA therapy. CBC with diff with elevated eosinophils; IgE nl.  02/03/2022: OV with Dr. Delton Coombes. Recent flare in May. Benefited significantly from prednisone. Still on Symbicort; has a hoarse voice but no stridor. Unclear whether he is benefiting from use of not. Trial off to see if he tolerates.   05/03/2022: OV with Parrett,NP. Increased cough, wheezing, dyspnea over the past 3 weeks. Recurrent asthma flares over the last 6 months. This is his third. He has received 2 steroid tapers previously. He was started on symbicort after the second. Taken off in June due to hoarseness. Treated for recurrent asthma exacerbation. Restarted Symbicort with spacer. Added singulair daily. Changed claritin to zyrtec. Treated with prednisone taper and albuterol. Cough control measures.  08/09/2022: Ov with Jabaree Mercado NP for follow up. He has been doing well since he was here in September. Feels like the Symbicort is controlling his symptoms. This is the longest he's gone without a flare in the past year. Feels like his breathing is good and he's able to do things around the house without any trouble. He denies any cough, wheezing, chest congestion.  He is using his Symbicort on a regular basis. Does feel like the spacer helped with his hoarseness, which is still present but minimal. Hasn't had to use his rescue since September. Sinus symptoms are stable. He is going on a river cruise for almost 12 days at the end of April. Wants to know if he can take some prednisone with him in case he has trouble.   05/24/2023: Today -  follow up Patient presents today for follow up. He has been doing well since he was here last. Has not had any exacerbations requiring steroids or abx. His breathing feels stable. Able to complete activities without difficulties. No significant cough, chest congestion or wheezing. He's using Symbicort twice a day. Takes his singulair at bedtime. Needs refills.   ACT 23  No Known Allergies  Immunization History  Administered Date(s) Administered   Moderna Sars-Covid-2 Vaccination 10/07/2019, 10/24/2019, 11/05/2019, 06/22/2020   Pneumococcal Conjugate-13 10/22/2014   Pneumococcal Polysaccharide-23 03/24/2012   Td 03/23/2005   Tdap 09/03/2011, 06/15/2016    Past Medical History:  Diagnosis Date   Benign localized prostatic hyperplasia with lower urinary tract symptoms (LUTS)    Diverticulosis    GERD (gastroesophageal reflux disease)    watch diet   Glaucoma, both eyes    History of adenomatous polyp of colon    History of syncope    per pt has hx of syncopal episodes due to orthostatic hypotension,  last one 04/ 2019 and before that 2017   Hypercholesteremia    Hypertension    Mild intermittent asthma    Personal history of malignant melanoma 1998   s/p  wide local excision from mid lower back ,  per pt sln's negative and no recurrence   Prediabetes    Wears glasses     Tobacco History: Social History   Tobacco Use  Smoking Status Former   Types: Cigarettes  Smokeless Tobacco Never  Tobacco Comments   Pt smoked 60 years ago in high school   Counseling given: Not Answered Tobacco comments: Pt smoked 60 years ago in high school   Outpatient Medications Prior to Visit  Medication Sig Dispense Refill   albuterol (VENTOLIN HFA) 108 (90 Base) MCG/ACT inhaler INHALE 2 PUFFS BY MOUTH EVERY 4 HOURS ASNEEDED FOR WHEEZING OR SHORTNESS OF BREATH 8.5 g 2   ALPHAGAN P 0.1 % SOLN Place 1 drop into both eyes 2 (two) times daily.      amLODipine (NORVASC) 5 MG tablet Take 5 mg by  mouth at bedtime.      aspirin EC 81 MG tablet Take 81 mg by mouth daily. Swallow whole.     Cetirizine HCl (ZYRTEC PO) Take by mouth.     dorzolamide-timolol (COSOPT) 22.3-6.8 MG/ML ophthalmic solution Place 1 drop into both eyes 2 (two) times daily.      famotidine (PEPCID) 20 MG tablet 1 tablet     latanoprost (XALATAN) 0.005 % ophthalmic solution      rosuvastatin (CRESTOR) 40 MG tablet Take 40 mg by mouth daily.     SYMBICORT 80-4.5 MCG/ACT inhaler INHALE 2 PUFFS BY MOUTH TWICE DAILY 10.2 g 6   valsartan (DIOVAN) 80 MG tablet Take 80 mg by mouth daily.     montelukast (SINGULAIR) 10 MG tablet Take 1 tablet (10 mg total) by mouth at bedtime. 30 tablet 5   gabapentin (NEURONTIN) 300 MG capsule Take 1 capsule (300 mg total) by mouth 3 (three) times daily. (Patient taking differently: Take 300 mg by mouth as  needed.) 100 capsule 0   pravastatin (PRAVACHOL) 40 MG tablet Take 40 mg by mouth daily.     No facility-administered medications prior to visit.     Review of Systems:   Constitutional: No weight loss or gain, night sweats, fevers, chills, fatigue, or lassitude. HEENT: No headaches, difficulty swallowing, tooth/dental problems, or sore throat. No sneezing, itching, ear ache, nasal congestion, or post nasal drip CV:  No chest pain, orthopnea, PND, swelling in lower extremities, anasarca, dizziness, palpitations, syncope Resp: No shortness of breath with exertion or at rest. No cough. No excess mucus or change in color of mucus. No wheezing. No hemoptysis. No chest wall deformity GI:  No heartburn, indigestion Skin: No rash, lesions, ulcerations MSK:  No joint pain or swelling.   Neuro: No dizziness or lightheadedness.  Psych: No depression or anxiety. Mood stable.     Physical Exam:  BP (!) 130/90 (BP Location: Right Arm, Cuff Size: Normal)   Pulse (!) 53   Ht 5\' 7"  (1.702 m)   Wt 159 lb 6.4 oz (72.3 kg)   SpO2 99%   BMI 24.97 kg/m   GEN: Pleasant, interactive,  well-appearing; in no acute distress. HEENT:  Normocephalic and atraumatic. PERRLA. Sclera white. Nasal turbinates pink, moist and patent bilaterally. No rhinorrhea present. Oropharynx pink and moist, without exudate or edema. No lesions, ulcerations, or postnasal drip.  NECK:  Supple w/ fair ROM. Thyroid symmetrical with no goiter or nodules palpated. No lymphadenopathy.   CV: RRR, no m/r/g, no peripheral edema. Pulses intact, +2 bilaterally. No cyanosis, pallor or clubbing. PULMONARY:  Unlabored, regular breathing. Clear bilaterally A&P w/o wheezes/rales/rhonchi. No accessory muscle use. No dullness to percussion. GI: BS present and normoactive. Soft, non-tender to palpation. No organomegaly or masses detected.  MSK: No erythema, warmth or tenderness. Cap refil <2 sec all extrem. No deformities or joint swelling noted.  Neuro: A/Ox3. No focal deficits noted.   Skin: Warm, no lesions or rashe Psych: Normal affect and behavior. Judgement and thought content appropriate.     Lab Results:  CBC    Component Value Date/Time   WBC 9.9 08/22/2022 1847   RBC 5.10 08/22/2022 1847   HGB 15.4 08/22/2022 1847   HCT 46.2 08/22/2022 1847   PLT 211 08/22/2022 1847   MCV 90.6 08/22/2022 1847   MCH 30.2 08/22/2022 1847   MCHC 33.3 08/22/2022 1847   RDW 12.8 08/22/2022 1847   LYMPHSABS 2.6 08/22/2022 1847   MONOABS 1.1 (H) 08/22/2022 1847   EOSABS 0.2 08/22/2022 1847   BASOSABS 0.0 08/22/2022 1847    BMET    Component Value Date/Time   NA 140 08/22/2022 1847   K 4.0 08/22/2022 1847   CL 106 08/22/2022 1847   CO2 26 08/22/2022 1847   GLUCOSE 94 08/22/2022 1847   BUN 14 08/22/2022 1847   CREATININE 1.16 08/22/2022 1847   CALCIUM 9.2 08/22/2022 1847   GFRNONAA >60 08/22/2022 1847   GFRAA >60 06/30/2018 0957    BNP No results found for: "BNP"   Imaging:  No results found.  Administration History     None           Latest Ref Rng & Units 06/04/2020   10:51 AM 03/01/2017     2:47 PM  PFT Results  FVC-Pre L 3.40  3.50   FVC-Predicted Pre % 92  89   FVC-Post L 3.50  3.70   FVC-Predicted Post % 95  94   Pre FEV1/FVC % % 66  69   Post FEV1/FCV % % 62  73   FEV1-Pre L 2.23  2.40   FEV1-Predicted Pre % 84  83   FEV1-Post L 2.18  2.71   DLCO uncorrected ml/min/mmHg 23.20  22.96   DLCO UNC% % 103  81   DLCO corrected ml/min/mmHg 23.20  22.77   DLCO COR %Predicted % 103  80   DLVA Predicted % 109  100   TLC L 5.68  5.48   TLC % Predicted % 91  85   RV % Predicted % 100  80     No results found for: "NITRICOXIDE"      Assessment & Plan:   Mild persistent asthma Well controlled. No recent exacerbations requiring steroids/abx. Encouraged to remain active. Action plan in place. Refills sent.  Patient Instructions  Continue Albuterol inhaler 2 puffs every 6 hours as needed for shortness of breath or wheezing. Notify if symptoms persist despite rescue inhaler/neb use. Continue Claritin (loratidine) 10 mg daily for allergies Continue Pepcid (famotidine) 20 mg daily  Continue Symbicort 2 puffs Twice daily. Brush tongue and rinse mouth afterwards.  Continue montelukast (singulair) 1 tab At bedtime    Follow up in 6 months with Dr. Delton Coombes. If symptoms worsen, please contact office for sooner follow up or seek emergency care.   GERD (gastroesophageal reflux disease) Stable on PPI  Seasonal allergies Controlled on current regimen.     I spent 25 minutes of dedicated to the care of this patient on the date of this encounter to include pre-visit review of records, face-to-face time with the patient discussing conditions above, post visit ordering of testing, clinical documentation with the electronic health record, making appropriate referrals as documented, and communicating necessary findings to members of the patients care team.  Noemi Chapel, NP 05/24/2023  Pt aware and understands NP's role.

## 2023-05-24 NOTE — Assessment & Plan Note (Signed)
Stable on PPI 

## 2023-05-24 NOTE — Assessment & Plan Note (Signed)
Controlled on current regimen.   

## 2023-05-24 NOTE — Patient Instructions (Signed)
Continue Albuterol inhaler 2 puffs every 6 hours as needed for shortness of breath or wheezing. Notify if symptoms persist despite rescue inhaler/neb use. Continue Claritin (loratidine) 10 mg daily for allergies Continue Pepcid (famotidine) 20 mg daily  Continue Symbicort 2 puffs Twice daily. Brush tongue and rinse mouth afterwards.  Continue montelukast (singulair) 1 tab At bedtime    Follow up in 6 months with Dr. Delton Coombes. If symptoms worsen, please contact office for sooner follow up or seek emergency care.

## 2023-05-24 NOTE — Assessment & Plan Note (Signed)
Well controlled. No recent exacerbations requiring steroids/abx. Encouraged to remain active. Action plan in place. Refills sent.  Patient Instructions  Continue Albuterol inhaler 2 puffs every 6 hours as needed for shortness of breath or wheezing. Notify if symptoms persist despite rescue inhaler/neb use. Continue Claritin (loratidine) 10 mg daily for allergies Continue Pepcid (famotidine) 20 mg daily  Continue Symbicort 2 puffs Twice daily. Brush tongue and rinse mouth afterwards.  Continue montelukast (singulair) 1 tab At bedtime    Follow up in 6 months with Dr. Delton Coombes. If symptoms worsen, please contact office for sooner follow up or seek emergency care.

## 2023-06-03 DIAGNOSIS — H903 Sensorineural hearing loss, bilateral: Secondary | ICD-10-CM | POA: Diagnosis not present

## 2023-06-28 DIAGNOSIS — L905 Scar conditions and fibrosis of skin: Secondary | ICD-10-CM | POA: Diagnosis not present

## 2023-06-28 DIAGNOSIS — L814 Other melanin hyperpigmentation: Secondary | ICD-10-CM | POA: Diagnosis not present

## 2023-06-28 DIAGNOSIS — L821 Other seborrheic keratosis: Secondary | ICD-10-CM | POA: Diagnosis not present

## 2023-06-28 DIAGNOSIS — Z85820 Personal history of malignant melanoma of skin: Secondary | ICD-10-CM | POA: Diagnosis not present

## 2023-06-28 DIAGNOSIS — Z85828 Personal history of other malignant neoplasm of skin: Secondary | ICD-10-CM | POA: Diagnosis not present

## 2023-06-28 DIAGNOSIS — L570 Actinic keratosis: Secondary | ICD-10-CM | POA: Diagnosis not present

## 2023-06-28 DIAGNOSIS — L720 Epidermal cyst: Secondary | ICD-10-CM | POA: Diagnosis not present

## 2023-07-01 DIAGNOSIS — Z961 Presence of intraocular lens: Secondary | ICD-10-CM | POA: Diagnosis not present

## 2023-07-01 DIAGNOSIS — H401133 Primary open-angle glaucoma, bilateral, severe stage: Secondary | ICD-10-CM | POA: Diagnosis not present

## 2023-07-18 DIAGNOSIS — H903 Sensorineural hearing loss, bilateral: Secondary | ICD-10-CM | POA: Diagnosis not present

## 2023-11-02 ENCOUNTER — Ambulatory Visit: Payer: Medicare PPO | Admitting: Emergency Medicine

## 2023-11-30 ENCOUNTER — Encounter: Payer: Self-pay | Admitting: Emergency Medicine

## 2023-11-30 ENCOUNTER — Ambulatory Visit: Payer: Medicare PPO | Admitting: Emergency Medicine

## 2023-11-30 VITALS — BP 134/76 | HR 57 | Ht 68.0 in | Wt 163.6 lb

## 2023-11-30 DIAGNOSIS — R058 Other specified cough: Secondary | ICD-10-CM

## 2023-11-30 DIAGNOSIS — J302 Other seasonal allergic rhinitis: Secondary | ICD-10-CM | POA: Diagnosis not present

## 2023-11-30 DIAGNOSIS — J4530 Mild persistent asthma, uncomplicated: Secondary | ICD-10-CM

## 2023-11-30 DIAGNOSIS — K219 Gastro-esophageal reflux disease without esophagitis: Secondary | ICD-10-CM | POA: Diagnosis not present

## 2023-11-30 MED ORDER — SYMBICORT 80-4.5 MCG/ACT IN AERO: 2.0000 | INHALATION_SPRAY | Freq: Two times a day (BID) | RESPIRATORY_TRACT | 6 refills | Status: DC

## 2023-11-30 MED ORDER — ALBUTEROL SULFATE HFA 108 (90 BASE) MCG/ACT IN AERS: INHALATION_SPRAY | RESPIRATORY_TRACT | 2 refills | Status: AC

## 2023-11-30 NOTE — Assessment & Plan Note (Signed)
 Continue same regimen

## 2023-11-30 NOTE — Progress Notes (Signed)
   Subjective:    Patient ID: Spencer Lloyd., male    DOB: 14-Apr-1946, 78 y.o.   MRN: [id number]  HPI  ROV 11/25/22  --Spencer Lloyd is 50 and has a story of mild intermittent asthma, chronic cough, chronic rhinitis, GERD.  He has mild obstruction on his pulmonary function testing. Currently managed on Symbicort 80, Singulair, Zyrtec, Pepcid once daily.  Uses albuterol Today he reports that he is doing well. He was treated with pred tapers x 3 last year, last time was 04/2022. He is back on Symbicort and is benefiting, also started on Singulair. Never needs his albuterol unless he is flaring.   ROV 11/30/2023 --follow-up visit for 78 year old gentleman with mild intermittent asthma, chronic cough in the setting of chronic rhinitis and GERD.  He is on Symbicort, Singulair, Zyrtec, Pepcid once daily.  Today he reports that he is feeling well. He has not required any prednisone since last time. He had some increased congestion last month but improving now. He also had a URI at that time - now better. Rare albuterol use. He has some hoarse voice.  He does not want flu shot, does get the covid and RSV  Review of Systems As per HPI     Objective:   Physical Exam Vitals:   11/30/23 0911 11/30/23 0913  BP: (!) 148/62 134/76  Pulse: (!) 57   SpO2: 98%   Weight: 163 lb 9.6 oz (74.2 kg)   Height: 5\' 8"  (1.727 m)    Gen: Pleasant, well-nourished, in no distress,  normal affect  ENT: No lesions,  mouth clear,  oropharynx clear, no postnasal drip, scratchy gravelly voice  Neck: No JVD, no stridor  Lungs: No use of accessory muscles, no crackles. No wheeze today.   Cardiovascular: RRR, heart sounds normal, no murmur or gallops, no peripheral edema  Musculoskeletal: No deformities, no cyanosis or clubbing  Neuro: alert, awake, non focal  Skin: Warm, no lesions or rash     Assessment & Plan:  Mild persistent asthma Overall doing well.  He does have some gravelly voice but denies any overt  flares of his asthma, dyspnea, wheezing.  He even got through the allergy season and a recent URI without requiring any prednisone.  Has not required any year.  He does not get the flu shot but is up-to-date on his COVID and RSV.  Please continue your Symbicort twice a day. Rinse and gargle after using. Consider adding back a spacer.  Keep your albuterol available to use 2 puffs if needed for shortness of breath, chest tightness, wheezing. Keep your vaccinations up-to-date Continue your Singulair each evening as you have been taking it Follow with K. Cobb in 6 months Follow Dr. Delton Coombes in 12 months, sooner if you have any problems.  GERD (gastroesophageal reflux disease) Continue to treat as a contributor to his upper airway disease and cough  Seasonal allergies Continue same regimen  Upper airway cough syndrome Some voice hoarseness but overall stable.   Levy Pupa, MD, PhD 11/30/2023, 9:41 AM [facility] [phone] or if no answer [phone]

## 2023-11-30 NOTE — Patient Instructions (Addendum)
 Please continue your Symbicort twice a day. Rinse and gargle after using. Consider adding back a spacer.  Keep your albuterol available to use 2 puffs if needed for shortness of breath, chest tightness, wheezing. Keep your vaccinations up-to-date Continue your Singulair each evening as you have been taking it Continue Zyrtec, Pepcid Follow with K. Cobb in 6 months Follow Dr. Delton Coombes in 12 months, sooner if you have any problems.

## 2023-11-30 NOTE — Assessment & Plan Note (Signed)
 Continue to treat as a contributor to his upper airway disease and cough

## 2023-11-30 NOTE — Assessment & Plan Note (Signed)
 Overall doing well.  He does have some gravelly voice but denies any overt flares of his asthma, dyspnea, wheezing.  He even got through the allergy season and a recent URI without requiring any prednisone.  Has not required any year.  He does not get the flu shot but is up-to-date on his COVID and RSV.  Please continue your Symbicort twice a day. Rinse and gargle after using. Consider adding back a spacer.  Keep your albuterol available to use 2 puffs if needed for shortness of breath, chest tightness, wheezing. Keep your vaccinations up-to-date Continue your Singulair each evening as you have been taking it Follow with K. Cobb in 6 months Follow Dr. Delton Coombes in 12 months, sooner if you have any problems.

## 2023-11-30 NOTE — Addendum Note (Signed)
 Addended byFloydene Flock, Shyheim Tanney A on: 11/30/2023 09:44 AM   Modules accepted: Orders

## 2023-11-30 NOTE — Assessment & Plan Note (Signed)
 Some voice hoarseness but overall stable.

## 2024-01-10 ENCOUNTER — Ambulatory Visit: Payer: Self-pay

## 2024-01-10 NOTE — Telephone Encounter (Signed)
FYI  pt has appt tomorrow.

## 2024-01-10 NOTE — Telephone Encounter (Signed)
 Thank you :)

## 2024-01-10 NOTE — Telephone Encounter (Signed)
 Copied from CRM [id number]. Topic: Clinical - Red Word Triage >> Jan 10, 2024  8:13 AM Crist Dominion wrote: Red Word that prompted transfer to Nurse Triage: Worsening shortness of breath.  TRIAGE SUMMARY: AN Is reporting worsening dyspnea and wheezing over the weekend. The patient reports having a history of these episodes that are made better by means of treatment with Prednisone . The patient preferred to wait on Dr. Baldwin Levee and was not willing to see anyone else. Offered and recommended the patient seek care today, but the patient did not agree with the offered disposition. Scheduled patient with preferred provider for tomorrow. Recommended the patient seek the nearest UC for timely evaluation and treatment, patient verbalized understanding, stated "I will."  E2C2 Pulmonary Triage - Initial Assessment Questions "Chief Complaint (e.g., cough, sob, wheezing, fever, chills, sweat or additional symptoms) *Go to specific symptom protocol after initial questions.  Worsening Dyspnea Wheezing  "How long have symptoms been present?" A couple of days  Have you tested for COVID or Flu? Note: If not, ask patient if a home test can be taken. If so, instruct patient to call back for positive results. No  MEDICINES:   "Have you used any OTC meds to help with symptoms?" No If yes, ask "What medications?" No  "Have you used your inhalers/maintenance medication?" Yes If yes, "What medications?" Symbicort  Albuterol   If inhaler, ask "How many puffs and how often?" Note: Review instructions on medication in the chart. Symbicort  BID  OXYGEN: "Do you wear supplemental oxygen?" No If yes, "How many liters are you supposed to use?" N/A  "Do you monitor your oxygen levels?" No If yes, "What is your reading (oxygen level) today?" Does not monitor  "What is your usual oxygen saturation reading?"  (Note: Pulmonary O2 sats should be 90% or greater) Unsure   Reason for Disposition  [1] Longstanding  difficulty breathing (e.g., CHF, COPD, emphysema) AND [2] WORSE than normal  Answer Assessment - Initial Assessment Questions 1. RESPIRATORY STATUS: "Describe your breathing?" (e.g., wheezing, shortness of breath, unable to speak, severe coughing)      Dyspnea with exertion and resst  2. ONSET: "When did this breathing problem begin?"      A couple of days  3. PATTERN "Does the difficult breathing come and go, or has it been constant since it started?"      Intermittent  4. SEVERITY: "How bad is your breathing?" (e.g., mild, moderate, severe)    - MILD: No SOB at rest, mild SOB with walking, speaks normally in sentences, can lie down, no retractions, pulse < 100.    - MODERATE: SOB at rest, SOB with minimal exertion and prefers to sit, cannot lie down flat, speaks in phrases, mild retractions, audible wheezing, pulse 100-120.    - SEVERE: Very SOB at rest, speaks in single words, struggling to breathe, sitting hunched forward, retractions, pulse > 120      Moderate  5. RECURRENT SYMPTOM: "Have you had difficulty breathing before?" If Yes, ask: "When was the last time?" and "What happened that time?"      Yes, treated with Prednisone   6. CARDIAC HISTORY: "Do you have any history of heart disease?" (e.g., heart attack, angina, bypass surgery, angioplasty)      No  7. LUNG HISTORY: "Do you have any history of lung disease?"  (e.g., pulmonary embolus, asthma, emphysema)     *No Answer* 8. CAUSE: "What do you think is causing the breathing problem?"      Unsure  9.  OTHER SYMPTOMS: "Do you have any other symptoms? (e.g., dizziness, runny nose, cough, chest pain, fever)     Cough-Dry  10. O2 SATURATION MONITOR:  "Do you use an oxygen saturation monitor (pulse oximeter) at home?" If Yes, ask: "What is your reading (oxygen level) today?" "What is your usual oxygen saturation reading?" (e.g., 95%)       Does not monitor  12. TRAVEL: "Have you traveled out of the country in the last month?"  (e.g., travel history, exposures)       *No Answer*  Protocols used: Breathing Difficulty-A-AH

## 2024-01-11 ENCOUNTER — Ambulatory Visit: Admitting: Emergency Medicine

## 2024-01-11 ENCOUNTER — Encounter: Payer: Self-pay | Admitting: Emergency Medicine

## 2024-01-11 VITALS — BP 142/80 | HR 63 | Ht 67.0 in | Wt 161.6 lb

## 2024-01-11 DIAGNOSIS — K219 Gastro-esophageal reflux disease without esophagitis: Secondary | ICD-10-CM

## 2024-01-11 DIAGNOSIS — J4530 Mild persistent asthma, uncomplicated: Secondary | ICD-10-CM

## 2024-01-11 DIAGNOSIS — J302 Other seasonal allergic rhinitis: Secondary | ICD-10-CM | POA: Diagnosis not present

## 2024-01-11 DIAGNOSIS — R058 Other specified cough: Secondary | ICD-10-CM | POA: Diagnosis not present

## 2024-01-11 MED ORDER — SYMBICORT 80-4.5 MCG/ACT IN AERO: 2.0000 | INHALATION_SPRAY | Freq: Two times a day (BID) | RESPIRATORY_TRACT | 6 refills | Status: DC

## 2024-01-11 MED ORDER — PREDNISONE 10 MG PO TABS: ORAL_TABLET | ORAL | 0 refills | Status: DC

## 2024-01-11 NOTE — Progress Notes (Signed)
   Subjective:    Patient ID: Spencer Loving., male    DOB: 1946/03/29, 78 y.o.   MRN: [id number]  HPI  ROV 11/25/22  --Spencer Lloyd is 78 and has a story of mild intermittent asthma, chronic cough, chronic rhinitis, GERD.  He has mild obstruction on his pulmonary function testing. Currently managed on Symbicort  80, Singulair , Zyrtec, Pepcid once daily.  Uses albuterol  Today he reports that he is doing well. He was treated with pred tapers x 3 last year, last time was 04/2022. He is back on Symbicort  and is benefiting, also started on Singulair . Never needs his albuterol  unless he is flaring.   ROV 11/30/2023 --follow-up visit for 78 year old gentleman with mild intermittent asthma, chronic cough in the setting of chronic rhinitis and GERD.  He is on Symbicort , Singulair , Zyrtec, Pepcid once daily.  Today he reports that he is feeling well. He has not required any prednisone  since last time. He had some increased congestion last month but improving now. He also had a URI at that time - now better. Rare albuterol  use. He has some hoarse voice.  He does not want flu shot, does get the covid and RSV  Acute office visit 01/11/2024 --Spencer Lloyd is a 78 with a history of chronic cough and mild intermittent asthma.  He has chronic rhinitis and GERD that impact both.  I saw him about 6 weeks ago and he was doing fairly well. Then over last 3-4 days he developed increased UA irritation, scratchiness. Increased cough, mainly dry. Very subtle SOB when he exerts. No clear precipitant. No overt reflux on famotidine. Also on zyrtec. Has used some albuterol  for some nocturnal wheeze.    Review of Systems As per HPI     Objective:   Physical Exam Vitals:   01/11/24 0929 01/11/24 0930  BP: (!) 196/104 (!) 142/80  Pulse: 63   SpO2: 97%   Weight: 161 lb 9.6 oz (73.3 kg)   Height: 5\' 7"  (1.702 m)    Gen: Pleasant, well-nourished, in no distress,  normal affect  ENT: No lesions,  mouth clear,  oropharynx  clear, no postnasal drip, scratchy gravelly voice  Neck: No JVD, no stridor  Lungs: No use of accessory muscles, no crackles. No wheeze today.   Cardiovascular: RRR, heart sounds normal, no murmur or gallops, no peripheral edema  Musculoskeletal: No deformities, no cyanosis or clubbing  Neuro: alert, awake, non focal  Skin: Warm, no lesions or rash     Assessment & Plan:  Mild persistent asthma Flaring symptoms with clear precipitant.  He does not really have a URI.  Mostly upper airway in nature although he has had some chest tightness, dyspnea, wheeze.  Will treat him for an acute flare with prednisone .  Hold off on antibiotics for now.  Continue his Symbicort , albuterol  as needed and continue to treat his underlying exacerbating factors of reflux and rhinitis.  GERD (gastroesophageal reflux disease) Fairly well-managed on famotidine.  Plan to continue.  Seasonal allergies Continue cetirizine as ordered.  He denies any increase in allergy or rhinitis burden.  Upper airway cough syndrome Having more cough, throat irritation, overlapping with the asthma flare.  Hopefully will improve with the prednisone     Racheal Buddle, MD, PhD 01/11/2024, 9:54 AM [facility] [phone] or if no answer [phone]

## 2024-01-11 NOTE — Assessment & Plan Note (Signed)
 Continue cetirizine as ordered.  He denies any increase in allergy or rhinitis burden.

## 2024-01-11 NOTE — Assessment & Plan Note (Signed)
 Flaring symptoms with clear precipitant.  He does not really have a URI.  Mostly upper airway in nature although he has had some chest tightness, dyspnea, wheeze.  Will treat him for an acute flare with prednisone .  Hold off on antibiotics for now.  Continue his Symbicort , albuterol  as needed and continue to treat his underlying exacerbating factors of reflux and rhinitis.

## 2024-01-11 NOTE — Addendum Note (Signed)
 Addended byGregory Leash, Hartley Urton A on: 01/11/2024 09:55 AM   Modules accepted: Orders

## 2024-01-11 NOTE — Assessment & Plan Note (Signed)
 Having more cough, throat irritation, overlapping with the asthma flare.  Hopefully will improve with the prednisone 

## 2024-01-11 NOTE — Assessment & Plan Note (Signed)
 Fairly well-managed on famotidine.  Plan to continue.

## 2024-01-11 NOTE — Patient Instructions (Addendum)
 Continue Symbicort  2 puffs twice a day.  Rinse and gargle after using. Please take prednisone  as directed until completely gone Continue your famotidine (Pepcid) as you have been taking Continue cetirizine (Zyrtec) as you have been taking Keep your albuterol  available to use 2 puffs when needed for shortness of breath, chest tightness, wheezing. Follow with K. Cobb in about 6 months, follow Dr. Baldwin Levee in 1 year.  Call sooner if you have any problems.

## 2024-05-23 ENCOUNTER — Other Ambulatory Visit: Payer: Self-pay | Admitting: Nurse Practitioner

## 2024-05-23 DIAGNOSIS — J4530 Mild persistent asthma, uncomplicated: Secondary | ICD-10-CM

## 2024-07-06 ENCOUNTER — Ambulatory Visit: Admitting: Nurse Practitioner

## 2024-07-06 ENCOUNTER — Encounter: Payer: Self-pay | Admitting: Nurse Practitioner

## 2024-07-06 VITALS — BP 128/78 | HR 62 | Temp 98.2°F | Ht 67.0 in | Wt 161.8 lb

## 2024-07-06 DIAGNOSIS — K219 Gastro-esophageal reflux disease without esophagitis: Secondary | ICD-10-CM | POA: Diagnosis not present

## 2024-07-06 DIAGNOSIS — J4530 Mild persistent asthma, uncomplicated: Secondary | ICD-10-CM

## 2024-07-06 DIAGNOSIS — R490 Dysphonia: Secondary | ICD-10-CM | POA: Diagnosis not present

## 2024-07-06 DIAGNOSIS — R058 Other specified cough: Secondary | ICD-10-CM

## 2024-07-06 MED ORDER — SYMBICORT 80-4.5 MCG/ACT IN AERO: 2.0000 | INHALATION_SPRAY | Freq: Two times a day (BID) | RESPIRATORY_TRACT | 6 refills | Status: AC

## 2024-07-06 NOTE — Assessment & Plan Note (Signed)
 Stable on PPI

## 2024-07-06 NOTE — Progress Notes (Signed)
 @Patient  ID: Spencer Lloyd., male    DOB: 07-30-1946, 78 y.o.   MRN: [id number]  Chief Complaint  Patient presents with   Asthma    Doing well. No sx noted.    Referring provider: No ref. provider found  HPI: 78 year old male, never smoker followed for asthma and upper airway cough syndrome.  He is a patient Dr. Lanny and was last seen in office on 01/11/2024.  Past medical history significant for hypertension, GERD, BPH, HLD, prediabetes.  TEST/EVENTS:  06/04/2020 PFTs: FVC 95, FEV1 84, ratio 62, TLC 91, DLCOcor 103%.  No BD.  Mild obstructive airway disease without reversibility and normal diffusion capacity. 10/28/2021 CXR 2 view: Both lungs were clear without any evidence of acute process 12/23/2021 FeNO 43 ppb; absolute eos 400  01/11/2024: OV with Dr. Shelah. Acute visit. Last 3-4 days developed increased upper airway irritation, scratchiness, increased cough. Mainly dry. Very subtle SOB when he exerts. Some albuterol  for some nocturnal wheeze. Treated with prednisone . No abx.   07/06/2024: Today - follow up Discussed the use of AI scribe software for clinical note transcription with the patient, who gave verbal consent to proceed.  History of Present Illness Spencer Aragones. is a 78 year old male with asthma who presents for a routine follow-up visit.  He has not experienced any significant breathing issues since his last visit in May, where he had a flare-up likely due to spring allergies. He has not required any steroid treatments since then.  He continues to use Symbicort  twice daily and mentions that he needs a refill. He has not needed to use his albuterol  inhaler at all. He is also taking Singulair  (montelukast ) and recently refilled this medication. Additionally, he is taking Pepcid and Claritin  regularly.  He experiences intermittent voice hoarseness, which has been a chronic problem. He has not had an examination of his vocal cords by an ear, nose, and throat  specialist. No difficulties swallowing.   No significant cough. No wheezing, chest congestion. Doing well. No concerns or complaints.     No Known Allergies  Immunization History  Administered Date(s) Administered   Moderna Sars-Covid-2 Vaccination 10/07/2019, 10/24/2019, 11/05/2019, 06/22/2020   Pneumococcal Conjugate-13 10/22/2014   Pneumococcal Polysaccharide-23 03/24/2012   Td 03/23/2005   Tdap 09/03/2011, 06/15/2016    Past Medical History:  Diagnosis Date   Benign localized prostatic hyperplasia with lower urinary tract symptoms (LUTS)    Diverticulosis    GERD (gastroesophageal reflux disease)    watch diet   Glaucoma, both eyes    History of adenomatous polyp of colon    History of syncope    per pt has hx of syncopal episodes due to orthostatic hypotension,  last one 04/ 2019 and before that 2017   Hypercholesteremia    Hypertension    Mild intermittent asthma    Personal history of malignant melanoma 1998   s/p  wide local excision from mid lower back ,  per pt sln's negative and no recurrence   Prediabetes    Wears glasses     Tobacco History: Social History   Tobacco Use  Smoking Status Former   Types: Cigarettes  Smokeless Tobacco Never  Tobacco Comments   Pt smoked 60 years ago in high school   Counseling given: Not Answered Tobacco comments: Pt smoked 60 years ago in high school   Outpatient Medications Prior to Visit  Medication Sig Dispense Refill   albuterol  (VENTOLIN  HFA) 108 (90 Base) MCG/ACT inhaler INHALE  2 PUFFS BY MOUTH EVERY 4 HOURS ASNEEDED FOR WHEEZING OR SHORTNESS OF BREATH 8.5 g 2   ALPHAGAN  P 0.1 % SOLN Place 1 drop into both eyes 2 (two) times daily.      amLODipine  (NORVASC ) 5 MG tablet Take 5 mg by mouth at bedtime.      amlodipine -atorvastatin (CADUET) 10-10 MG tablet Take 1 tablet by mouth daily.     aspirin EC 81 MG tablet Take 81 mg by mouth daily. Swallow whole.     Cetirizine HCl (ZYRTEC PO) Take by mouth.      dorzolamide -timolol  (COSOPT ) 22.3-6.8 MG/ML ophthalmic solution Place 1 drop into both eyes 2 (two) times daily.      famotidine (PEPCID) 20 MG tablet 1 tablet     latanoprost  (XALATAN ) 0.005 % ophthalmic solution      montelukast  (SINGULAIR ) 10 MG tablet TAKE 1 TABLET BY MOUTH DAILY AT BEDTIME 90 tablet 3   predniSONE  (DELTASONE ) 10 MG tablet Take 40mg  daily for 3 days, then 30mg  daily for 3 days, then 20mg  daily for 3 days, then 10mg  daily for 3 days, then stop 30 tablet 0   rosuvastatin (CRESTOR) 40 MG tablet Take 40 mg by mouth daily.     valsartan (DIOVAN) 80 MG tablet Take 80 mg by mouth daily.     SYMBICORT  80-4.5 MCG/ACT inhaler Inhale 2 puffs into the lungs 2 (two) times daily. 10.2 g 6   No facility-administered medications prior to visit.     Review of Systems: as above    Physical Exam:  BP 128/78   Pulse 62   Temp 98.2 F (36.8 C) (Oral)   Ht 5' 7 (1.702 m)   Wt 161 lb 12.8 oz (73.4 kg)   SpO2 99% Comment: room air  BMI 25.34 kg/m   GEN: Pleasant, interactive, well-appearing; in no acute distress. HEENT:  Normocephalic and atraumatic. PERRLA. Sclera white. Nasal turbinates pink, moist and patent bilaterally. No rhinorrhea present. Oropharynx pink and moist, without exudate or edema. No lesions, ulcerations, or postnasal drip. Hoarse voice quality  NECK:  Supple w/ fair ROM. Thyroid symmetrical with no goiter or nodules palpated. No lymphadenopathy.   CV: RRR, no m/r/g, no peripheral edema. Pulses intact, +2 bilaterally. No cyanosis, pallor or clubbing. PULMONARY:  Unlabored, regular breathing. Clear bilaterally A&P w/o wheezes/rales/rhonchi. No accessory muscle use. No dullness to percussion. GI: BS present and normoactive. Soft, non-tender to palpation.  Neuro: A/Ox3. No focal deficits noted.   Skin: Warm, no lesions or rashe Psych: Normal affect and behavior. Judgement and thought content appropriate.     Lab Results:  CBC    Component Value Date/Time    WBC 9.9 08/22/2022 1847   RBC 5.10 08/22/2022 1847   HGB 15.4 08/22/2022 1847   HCT 46.2 08/22/2022 1847   PLT 211 08/22/2022 1847   MCV 90.6 08/22/2022 1847   MCH 30.2 08/22/2022 1847   MCHC 33.3 08/22/2022 1847   RDW 12.8 08/22/2022 1847   LYMPHSABS 2.6 08/22/2022 1847   MONOABS 1.1 (H) 08/22/2022 1847   EOSABS 0.2 08/22/2022 1847   BASOSABS 0.0 08/22/2022 1847    BMET    Component Value Date/Time   NA 140 08/22/2022 1847   K 4.0 08/22/2022 1847   CL 106 08/22/2022 1847   CO2 26 08/22/2022 1847   GLUCOSE 94 08/22/2022 1847   BUN 14 08/22/2022 1847   CREATININE 1.16 08/22/2022 1847   CALCIUM 9.2 08/22/2022 1847   GFRNONAA >60 08/22/2022 1847  GFRAA >60 06/30/2018 0957    BNP No results found for: BNP   Imaging:  No results found.  Administration History     None           Latest Ref Rng & Units 06/04/2020   10:51 AM 03/01/2017    2:47 PM  PFT Results  FVC-Pre L 3.40  3.50   FVC-Predicted Pre % 92  89   FVC-Post L 3.50  3.70   FVC-Predicted Post % 95  94   Pre FEV1/FVC % % 66  69   Post FEV1/FCV % % 62  73   FEV1-Pre L 2.23  2.40   FEV1-Predicted Pre % 84  83   FEV1-Post L 2.18  2.71   DLCO uncorrected ml/min/mmHg 23.20  22.96   DLCO UNC% % 103  81   DLCO corrected ml/min/mmHg 23.20  22.77   DLCO COR %Predicted % 103  80   DLVA Predicted % 109  100   TLC L 5.68  5.48   TLC % Predicted % 91  85   RV % Predicted % 100  80     No results found for: NITRICOXIDE      Assessment & Plan:   Mild persistent asthma Well controlled. Last exacerbation May 2025 requiring steroids; otherwise, has been well controlled. ACT 24. Compensated on current regimen. Encouraged to remain active. Action plan in place. Refills sent.  Patient Instructions  Continue Albuterol  inhaler 2 puffs every 6 hours as needed for shortness of breath or wheezing. Notify if symptoms persist despite rescue inhaler/neb use. Continue Claritin  (loratidine) 10 mg daily for  allergies Continue Pepcid (famotidine) 20 mg daily  Continue Symbicort  2 puffs Twice daily. Brush tongue and rinse mouth afterwards.  Continue montelukast  (singulair ) 1 tab At bedtime   Refilled symbicort    Referral to ENT due to the chronic voice hoarseness    Follow up in 1 year with Dr. Shelah. If symptoms worsen, please contact office for sooner follow up or seek emergency care.   GERD (gastroesophageal reflux disease) Stable on PPI  Upper airway cough syndrome Stable. Continue current regimen and spacer with inhaler.   Chronic hoarseness Chronic voice hoarseness. Possibly related to ICS and GERD. Will send to ENT for direct laryngoscopy to rule out laryngeal mass/nodules/VCD/etc.      I spent 31 minutes of dedicated to the care of this patient on the date of this encounter to include pre-visit review of records, face-to-face time with the patient discussing conditions above, post visit ordering of testing, clinical documentation with the electronic health record, making appropriate referrals as documented, and communicating necessary findings to members of the patients care team.  Comer LULLA Rouleau, NP 07/06/2024  Pt aware and understands NP's role.

## 2024-07-06 NOTE — Assessment & Plan Note (Signed)
 Well controlled. Last exacerbation May 2025 requiring steroids; otherwise, has been well controlled. ACT 24. Compensated on current regimen. Encouraged to remain active. Action plan in place. Refills sent.  Patient Instructions  Continue Albuterol  inhaler 2 puffs every 6 hours as needed for shortness of breath or wheezing. Notify if symptoms persist despite rescue inhaler/neb use. Continue Claritin  (loratidine) 10 mg daily for allergies Continue Pepcid (famotidine) 20 mg daily  Continue Symbicort  2 puffs Twice daily. Brush tongue and rinse mouth afterwards.  Continue montelukast  (singulair ) 1 tab At bedtime   Refilled symbicort    Referral to ENT due to the chronic voice hoarseness    Follow up in 1 year with Dr. Shelah. If symptoms worsen, please contact office for sooner follow up or seek emergency care.

## 2024-07-06 NOTE — Patient Instructions (Addendum)
 Continue Albuterol  inhaler 2 puffs every 6 hours as needed for shortness of breath or wheezing. Notify if symptoms persist despite rescue inhaler/neb use. Continue Claritin  (loratidine) 10 mg daily for allergies Continue Pepcid (famotidine) 20 mg daily  Continue Symbicort  2 puffs Twice daily. Brush tongue and rinse mouth afterwards.  Continue montelukast  (singulair ) 1 tab At bedtime   Refilled symbicort    Referral to ENT due to the chronic voice hoarseness    Follow up in 1 year with Dr. Shelah. If symptoms worsen, please contact office for sooner follow up or seek emergency care.

## 2024-07-06 NOTE — Assessment & Plan Note (Signed)
 Chronic voice hoarseness. Possibly related to ICS and GERD. Will send to ENT for direct laryngoscopy to rule out laryngeal mass/nodules/VCD/etc.

## 2024-07-06 NOTE — Assessment & Plan Note (Signed)
 Stable. Continue current regimen and spacer with inhaler.

## 2024-07-12 ENCOUNTER — Ambulatory Visit: Admitting: Nurse Practitioner

## 2024-07-20 ENCOUNTER — Encounter (INDEPENDENT_AMBULATORY_CARE_PROVIDER_SITE_OTHER): Payer: Self-pay

## 2024-08-24 ENCOUNTER — Institutional Professional Consult (permissible substitution) (INDEPENDENT_AMBULATORY_CARE_PROVIDER_SITE_OTHER)

## 2024-09-12 ENCOUNTER — Institutional Professional Consult (permissible substitution) (INDEPENDENT_AMBULATORY_CARE_PROVIDER_SITE_OTHER)

## 2024-09-12 NOTE — Progress Notes (Signed)
 " Cardiology Office Note:    Date:  09/15/2024   ID:  Spencer Lloyd., DOB 1945/10/13, MRN [id number]  PCP:  Spencer Charleston, MD (Inactive)  Cardiologist:  None  Electrophysiologist:  None   Referring MD: Spencer Opal, DO   Chief Complaint  Patient presents with   Loss of Consciousness    History of Present Illness:    Spencer Lloyd. is a 79 y.o. male with a hx of hypertension, hyperlipidemia, asthma who is referred by Dr. Auston for evaluation of recurrent syncope.  He reports history of multiple syncopal episodes, occurs every few years and has been happening since 57s.  Reports most recent episode was day after Christmas, was driving in passenger seat.  Started sweating and felt lightheaded and then within 5 minutes passed out.  States episodes are always similar to this, will have diaphoresis and lightheadedness and then pass out.  Typically when he feels the symptoms he will lie on the floor and it will improve but sometimes he is unable to do so.  States that about once per week will start having the symptoms but lie down and it improves.  When ambulance arrived during recent episode, his heart rate was in the 40s.  He wore a cardiac monitor with his PCP, do not have results.  Also reports chest pain that he describes as sharp pain lasting short duration, can occur at rest or with exertion.  Denies any dyspnea, edema, or palpitations.  Reports he smoked in high school.  Family history includes father died suddenly at 68 and brother died suddenly at 43.  Echocardiogram 09/23/2022 shows EF 60 to 65%, normal RV function, no significant valvular disease.  Carotid duplex 09/23/2022 showed near normal vessels with only minimal wall thickening or plaque.   Past Medical History:  Diagnosis Date   Benign localized prostatic hyperplasia with lower urinary tract symptoms (LUTS)    Diverticulosis    GERD (gastroesophageal reflux disease)    watch diet   Glaucoma, both eyes    History  of adenomatous polyp of colon    History of syncope    per pt has hx of syncopal episodes due to orthostatic hypotension,  last one 04/ 2019 and before that 2017   Hypercholesteremia    Hypertension    Mild intermittent asthma    Personal history of malignant melanoma 1998   s/p  wide local excision from mid lower back ,  per pt sln's negative and no recurrence   Prediabetes    Wears glasses     Past Surgical History:  Procedure Laterality Date   COLONOSCOPY  2011   MELANOMA EXCISION WITH SENTINEL LYMPH NODE BIOPSY  1998   from mid back ,  axillia nodes   TRANSURETHRAL RESECTION OF PROSTATE N/A 07/06/2018   Procedure: TRANSURETHRAL RESECTION OF THE PROSTATE (TURP);  Surgeon: Spencer Rush, MD;  Location: [facility];  Service: Urology;  Laterality: N/A;    Current Medications: Active Medications[1]   Allergies:   Patient has no known allergies.   Social History   Socioeconomic History   Marital status: Married    Spouse name: Not on file   Number of children: Not on file   Years of education: Not on file   Highest education level: Not on file  Occupational History   Not on file  Tobacco Use   Smoking status: Former    Types: Cigarettes   Smokeless tobacco: Never   Tobacco comments:    Pt  smoked 60 years ago in high school  Vaping Use   Vaping status: Never Used  Substance and Sexual Activity   Alcohol use: Yes    Alcohol/week: 0.0 standard drinks of alcohol    Comment: occas.   Drug use: Never   Sexual activity: Not on file    Comment: vasectomy  Other Topics Concern   Not on file  Social History Narrative   Not on file   Social Drivers of Health   Tobacco Use: Medium Risk (09/14/2024)   Patient History    Smoking Tobacco Use: Former    Smokeless Tobacco Use: Never    Passive Exposure: Not on Actuary Strain: Not on file  Food Insecurity: Not on file  Transportation Needs: Not on file  Physical Activity: Not on file  Stress: Not on file   Social Connections: Not on file  Depression (EYV7-0): Not on file  Alcohol Screen: Not on file  Housing: Not on file  Utilities: Not on file  Health Literacy: Not on file     Family History: The patient's family history is not on file.  ROS:   Please see the history of present illness.     All other systems reviewed and are negative.  EKGs/Labs/Other Studies Reviewed:    The following studies were reviewed today:   EKG:   09/14/2024: Sinus bradycardia, rate 51, incomplete right bundle branch block  Recent Labs: 09/14/2024: BUN 13; Creatinine, Ser 1.20; Potassium 4.1; Sodium 139  Recent Lipid Panel No results found for: CHOL, TRIG, HDL, CHOLHDL, VLDL, LDLCALC, LDLDIRECT  Physical Exam:    VS:  BP (!) 180/92 (BP Location: Right Arm, Patient Position: Sitting, Cuff Size: Normal)   Pulse (!) 118   Ht 5' 7 (1.702 m)   Wt 161 lb (73 kg)   SpO2 98%   BMI 25.22 kg/m     Wt Readings from Last 3 Encounters:  09/14/24 161 lb (73 kg)  07/06/24 161 lb 12.8 oz (73.4 kg)  01/11/24 161 lb 9.6 oz (73.3 kg)     GEN:  Well nourished, well developed in no acute distress HEENT: Normal NECK: No JVD; No carotid bruits LYMPHATICS: No lymphadenopathy CARDIAC: RRR, no murmurs, rubs, gallops RESPIRATORY:  Clear to auscultation without rales, wheezing or rhonchi  ABDOMEN: Soft, non-tender, non-distended MUSCULOSKELETAL:  No edema; No deformity  SKIN: Warm and dry NEUROLOGIC:  Alert and oriented x 3 PSYCHIATRIC:  Normal affect   ASSESSMENT:    1. Syncope, unspecified syncope type   2. Encounter to establish care   3. Chest pain of uncertain etiology   4. Essential hypertension   5. Hyperlipidemia, unspecified hyperlipidemia type    PLAN:    Recurrent syncope: Description suggests vasovagal syncope, as describes prodromal symptoms preceding syncopal episodes.  Discussed staying well-hydrated and using compression stockings and if having prodromal symptoms lying on  the ground.  Echocardiogram in 2024 showed no structural abnormalities.  Reports recently wore cardiac monitor, will obtain records from his PCP  Chest pain: Reports atypical chest pain but does state can occur with exertion, suggesting possible angina.  Does have significant CAD risk factors (age, hypertension, hyperlipidemia, concerning family history).  Recommend coronary CTA to evaluate for obstructive CAD.  Will not give Lopressor prior to study given low resting heart rate.  Hypertension: On amlodipine  5 mg daily and valsartan 80 mg daily.  BP significantly elevated, we will increase amlodipine  to 10 mg daily.  Asked to check BP twice daily for  next 2 weeks and let us  know results  Hyperlipidemia: On rosuvastatin 40 mg daily.  LDL 64 on 04/2024  RTC in 3 months  Medication Adjustments/Labs and Tests Ordered: Current medicines are reviewed at length with the patient today.  Concerns regarding medicines are outlined above.  Orders Placed This Encounter  Procedures   CT CORONARY MORPH W/CTA COR W/SCORE W/CA W/CM &/OR WO/CM   Basic Metabolic Panel (BMET)   EKG 12-Lead   Meds ordered this encounter  Medications   amLODipine  (NORVASC ) 10 MG tablet    Sig: Take 1 tablet (10 mg total) by mouth daily.    Dispense:  90 tablet    Refill:  3    Patient Instructions  Medication Instructions: Stop Amlodipine  5mg  Start AMLODIPINE  10MG  DAILY *If you need a refill on your cardiac medications before your next appointment, please call your pharmacy*  Lab Work: Bmet today If you have labs (blood work) drawn today and your tests are completely normal, you will receive your results only by: MyChart Message (if you have MyChart) OR A paper copy in the mail If you have any lab test that is abnormal or we need to change your treatment, we will call you to review the results.  Testing/Procedures:   Your cardiac CT will be scheduled at one of the below locations:   [facility] [address] [place], [place] [address] [phone] (Severe contrast allergies only)   [facility] [address]  [place], [place] [address]   If scheduled at [facility], please arrive at the [facility] and Children's Entrance (Entrance C2) of [facility] 30 minutes prior to test start time. You can use the FREE valet parking offered at entrance C (encouraged to control the heart rate for the test)  Proceed to the [facility] Radiology Department (first floor) to check-in and test prep.  All radiology patients and guests should use entrance C2 at [facility], accessed from [facility] even though the hospital's physical address listed is [address].  If scheduled at the Heart and Vascular Tower at [address], please enter the parking lot using the [address] entrance and use the FREE valet service at the patient drop-off area. Enter the building and check-in with registration on the main floor.  Please follow these instructions carefully (unless otherwise directed):  An IV will be required for this test and Nitroglycerin will be given.  Hold all erectile dysfunction medications at least 3 days (72 hrs) prior to test. (Ie viagra, cialis, sildenafil, tadalafil, etc)   On the Night Before the Test: Be sure to Drink plenty of water. Do not consume any caffeinated/decaffeinated beverages or chocolate 12 hours prior to your test. Do not take any antihistamines 12 hours prior to your test.  On the Day of the Test: Drink plenty of water until 1 hour prior to the test. Do not eat any food 1 hour prior to test. You may take your regular medications prior to the test.  If you take Furosemide/Hydrochlorothiazide/Spironolactone/Chlorthalidone, please HOLD on the morning of the test. Patients who wear a continuous glucose monitor MUST remove the device prior to scanning.       After the Test: Drink plenty  of water. After receiving IV contrast, you may experience a mild flushed feeling. This is normal. On occasion, you may experience a mild rash up to 24 hours after the test. This is not dangerous. If  this occurs, you can take Benadryl 25 mg, Zyrtec, Claritin , or Allegra and increase your fluid intake. (Patients taking Tikosyn should avoid Benadryl, and may take Zyrtec, Claritin , or Allegra) If you experience trouble breathing, this can be serious. If it is severe call 911 IMMEDIATELY. If it is mild, please call our office.  We will call to schedule your test 2-4 weeks out understanding that some insurance companies will need an authorization prior to the service being performed.   For more information and frequently asked questions, please visit our website : [email or web address]  For non-scheduling related questions, please contact the cardiac imaging nurse navigator should you have any questions/concerns: Cardiac Imaging Nurse Navigators Direct Office Dial: [phone]   For scheduling needs, including cancellations and rescheduling, please call Brittany, [phone].   Follow-Up: At [facility], you and your health needs are our priority.  As part of our continuing mission to provide you with exceptional heart care, our providers are all part of one team.  This team includes your primary Cardiologist (physician) and Advanced Practice Providers or APPs (Physician Assistants and Nurse Practitioners) who all work together to provide you with the care you need, when you need it.  Your next appointment:   3 months  Provider:   Dr. Kate  We recommend signing up for the patient portal called MyChart.  Sign up information is provided on this After Visit Summary.  MyChart is used to connect with patients for Virtual Visits (Telemedicine).  Patients are able to view lab/test results, encounter notes, upcoming appointments, etc.  Non-urgent messages can be sent to your  provider as well.   To learn more about what you can do with MyChart, go to [email or web address].   Other Instructions Please check blood pressure twice a day for next 2 weeks and send those reading            Signed, Lonni LITTIE Kate, MD  09/15/2024 9:17 PM    [facility]     [1]  Current Meds  Medication Sig   albuterol  (VENTOLIN  HFA) 108 (90 Base) MCG/ACT inhaler INHALE 2 PUFFS BY MOUTH EVERY 4 HOURS ASNEEDED FOR WHEEZING OR SHORTNESS OF BREATH   ALPHAGAN  P 0.1 % SOLN Place 1 drop into both eyes 2 (two) times daily.    amLODipine  (NORVASC ) 10 MG tablet Take 1 tablet (10 mg total) by mouth daily.   aspirin EC 81 MG tablet Take 81 mg by mouth daily. Swallow whole.   Cetirizine HCl (ZYRTEC PO) Take by mouth.   dorzolamide -timolol  (COSOPT ) 22.3-6.8 MG/ML ophthalmic solution Place 1 drop into both eyes 2 (two) times daily.    famotidine (PEPCID) 20 MG tablet 1 tablet   latanoprost  (XALATAN ) 0.005 % ophthalmic solution    montelukast  (SINGULAIR ) 10 MG tablet TAKE 1 TABLET BY MOUTH DAILY AT BEDTIME   predniSONE  (DELTASONE ) 10 MG tablet Take 40mg  daily for 3 days, then 30mg  daily for 3 days, then 20mg  daily for 3 days, then 10mg  daily for 3 days, then stop   rosuvastatin (CRESTOR) 40 MG tablet Take 40 mg by mouth daily.   SYMBICORT  80-4.5 MCG/ACT inhaler Inhale 2 puffs into the lungs 2 (two) times daily.   valsartan (DIOVAN) 80 MG tablet Take 80 mg by mouth daily.   [DISCONTINUED] amLODipine  (NORVASC ) 5 MG tablet Take 5 mg by mouth at bedtime.    [DISCONTINUED] amlodipine -atorvastatin (CADUET) 10-10 MG tablet Take 1 tablet by mouth daily.   "

## 2024-09-14 ENCOUNTER — Encounter: Payer: Self-pay | Admitting: Cardiology

## 2024-09-14 ENCOUNTER — Ambulatory Visit: Attending: Cardiology | Admitting: Cardiology

## 2024-09-14 VITALS — BP 180/92 | HR 118 | Ht 67.0 in | Wt 161.0 lb

## 2024-09-14 DIAGNOSIS — I10 Essential (primary) hypertension: Secondary | ICD-10-CM

## 2024-09-14 DIAGNOSIS — R55 Syncope and collapse: Secondary | ICD-10-CM | POA: Diagnosis not present

## 2024-09-14 DIAGNOSIS — R079 Chest pain, unspecified: Secondary | ICD-10-CM

## 2024-09-14 DIAGNOSIS — Z7689 Persons encountering health services in other specified circumstances: Secondary | ICD-10-CM | POA: Diagnosis not present

## 2024-09-14 DIAGNOSIS — E785 Hyperlipidemia, unspecified: Secondary | ICD-10-CM

## 2024-09-14 LAB — BASIC METABOLIC PANEL WITH GFR
BUN/Creatinine Ratio: 11 (ref 10–24)
BUN: 13 mg/dL (ref 8–27)
CO2: 23 mmol/L (ref 20–29)
Calcium: 9.6 mg/dL (ref 8.6–10.2)
Chloride: 101 mmol/L (ref 96–106)
Creatinine, Ser: 1.2 mg/dL (ref 0.76–1.27)
Glucose: 115 mg/dL — ABNORMAL HIGH (ref 70–99)
Potassium: 4.1 mmol/L (ref 3.5–5.2)
Sodium: 139 mmol/L (ref 134–144)
eGFR: 62 mL/min/1.73

## 2024-09-14 MED ORDER — AMLODIPINE BESYLATE 10 MG PO TABS: 10.0000 mg | ORAL_TABLET | Freq: Every day | ORAL | 3 refills | Status: AC

## 2024-09-14 NOTE — Patient Instructions (Addendum)
 Medication Instructions: Stop Amlodipine  5mg  Start AMLODIPINE  10MG  DAILY *If you need a refill on your cardiac medications before your next appointment, please call your pharmacy*  Lab Work: Bmet today If you have labs (blood work) drawn today and your tests are completely normal, you will receive your results only by: MyChart Message (if you have MyChart) OR A paper copy in the mail If you have any lab test that is abnormal or we need to change your treatment, we will call you to review the results.  Testing/Procedures:   Your cardiac CT will be scheduled at one of the below locations:   [facility] [address] [place], [place] [address] [phone] (Severe contrast allergies only)   [facility] [address]  [place], [place] [address]   If scheduled at [facility], please arrive at the [facility] and Children's Entrance (Entrance C2) of [facility] 30 minutes prior to test start time. You can use the FREE valet parking offered at entrance C (encouraged to control the heart rate for the test)  Proceed to the [facility] Radiology Department (first floor) to check-in and test prep.  All radiology patients and guests should use entrance C2 at [facility], accessed from [facility], even though the hospital's physical address listed is [address].  If scheduled at the Heart and Vascular Tower at [address], please enter the parking lot using the [address] entrance and use the FREE valet service at the patient drop-off area. Enter the building and check-in with registration on the main floor.  Please follow these instructions carefully (unless otherwise directed):  An IV will be required for this test and Nitroglycerin will be given.  Hold all erectile dysfunction medications at least 3 days (72 hrs) prior to test. (Ie viagra, cialis, sildenafil, tadalafil, etc)   On the Night  Before the Test: Be sure to Drink plenty of water. Do not consume any caffeinated/decaffeinated beverages or chocolate 12 hours prior to your test. Do not take any antihistamines 12 hours prior to your test.  On the Day of the Test: Drink plenty of water until 1 hour prior to the test. Do not eat any food 1 hour prior to test. You may take your regular medications prior to the test.  If you take Furosemide/Hydrochlorothiazide/Spironolactone/Chlorthalidone, please HOLD on the morning of the test. Patients who wear a continuous glucose monitor MUST remove the device prior to scanning.       After the Test: Drink plenty of water. After receiving IV contrast, you may experience a mild flushed feeling. This is normal. On occasion, you may experience a mild rash up to 24 hours after the test. This is not dangerous. If this occurs, you can take Benadryl 25 mg, Zyrtec, Claritin , or Allegra and increase your fluid intake. (Patients taking Tikosyn should avoid Benadryl, and may take Zyrtec, Claritin , or Allegra) If you experience trouble breathing, this can be serious. If it is severe call 911 IMMEDIATELY. If it is mild, please call our office.  We will call to schedule your test 2-4 weeks out understanding that some insurance companies will need an authorization prior to the service being performed.   For more information and frequently asked questions, please visit our website : [email or web address]  For non-scheduling related questions, please contact the cardiac imaging nurse navigator should you have any questions/concerns: Cardiac Imaging Nurse Navigators Direct Office Dial: [phone]   For scheduling needs,  including cancellations and rescheduling, please call Brittany, [phone].   Follow-Up: At [facility], you and your health needs are our priority.  As part of our continuing mission to provide you with exceptional heart care, our providers are all part of  one team.  This team includes your primary Cardiologist (physician) and Advanced Practice Providers or APPs (Physician Assistants and Nurse Practitioners) who all work together to provide you with the care you need, when you need it.  Your next appointment:   3 months  Provider:   Dr. Kate  We recommend signing up for the patient portal called MyChart.  Sign up information is provided on this After Visit Summary.  MyChart is used to connect with patients for Virtual Visits (Telemedicine).  Patients are able to view lab/test results, encounter notes, upcoming appointments, etc.  Non-urgent messages can be sent to your provider as well.   To learn more about what you can do with MyChart, go to [email or web address].   Other Instructions Please check blood pressure twice a day for next 2 weeks and send those reading

## 2024-09-16 ENCOUNTER — Ambulatory Visit: Payer: Self-pay | Admitting: Cardiology

## 2024-09-20 ENCOUNTER — Encounter (HOSPITAL_COMMUNITY): Payer: Self-pay

## 2024-09-21 ENCOUNTER — Ambulatory Visit (HOSPITAL_COMMUNITY)
Admission: RE | Admit: 2024-09-21 | Discharge: 2024-09-21 | Disposition: A | Source: Ambulatory Visit | Attending: Cardiovascular Disease | Admitting: Cardiovascular Disease

## 2024-09-21 ENCOUNTER — Other Ambulatory Visit: Payer: Self-pay | Admitting: Cardiovascular Disease

## 2024-09-21 DIAGNOSIS — I2510 Atherosclerotic heart disease of native coronary artery without angina pectoris: Secondary | ICD-10-CM | POA: Diagnosis not present

## 2024-09-21 DIAGNOSIS — R931 Abnormal findings on diagnostic imaging of heart and coronary circulation: Secondary | ICD-10-CM

## 2024-09-21 DIAGNOSIS — R079 Chest pain, unspecified: Secondary | ICD-10-CM | POA: Diagnosis present

## 2024-09-21 MED ORDER — IOHEXOL 350 MG/ML SOLN
100.0000 mL | Freq: Once | INTRAVENOUS | Status: AC | PRN
  Administered 2024-09-21: 100 mL via INTRAVENOUS

## 2024-09-21 MED ORDER — NITROGLYCERIN 0.4 MG SL SUBL
0.8000 mg | SUBLINGUAL_TABLET | Freq: Once | SUBLINGUAL | Status: AC
  Administered 2024-09-21: 0.8 mg via SUBLINGUAL

## 2024-09-24 ENCOUNTER — Institutional Professional Consult (permissible substitution) (INDEPENDENT_AMBULATORY_CARE_PROVIDER_SITE_OTHER)

## 2024-09-24 ENCOUNTER — Telehealth: Payer: Self-pay | Admitting: *Deleted

## 2024-09-24 NOTE — Telephone Encounter (Signed)
"   Called patient per  order from Dr Kate Meigs an appointment on 09/27/24 at 9 am  to discuss recent test.   Patient states  he will be able to come  to  appointment 09/27/24.   "

## 2024-09-27 ENCOUNTER — Encounter: Payer: Self-pay | Admitting: Cardiology

## 2024-09-27 ENCOUNTER — Ambulatory Visit: Admitting: Cardiology

## 2024-09-27 VITALS — BP 124/62 | HR 56 | Ht 67.0 in | Wt 162.0 lb

## 2024-09-27 DIAGNOSIS — Z79899 Other long term (current) drug therapy: Secondary | ICD-10-CM

## 2024-09-27 DIAGNOSIS — I25118 Atherosclerotic heart disease of native coronary artery with other forms of angina pectoris: Secondary | ICD-10-CM

## 2024-09-27 DIAGNOSIS — E785 Hyperlipidemia, unspecified: Secondary | ICD-10-CM | POA: Diagnosis not present

## 2024-09-27 DIAGNOSIS — R55 Syncope and collapse: Secondary | ICD-10-CM | POA: Diagnosis not present

## 2024-09-27 DIAGNOSIS — I10 Essential (primary) hypertension: Secondary | ICD-10-CM

## 2024-09-27 LAB — CBC

## 2024-09-27 MED ORDER — NITROGLYCERIN 0.4 MG SL SUBL: 0.4000 mg | SUBLINGUAL_TABLET | SUBLINGUAL | 11 refills | Status: AC | PRN

## 2024-09-27 NOTE — Patient Instructions (Addendum)
 Medication Instructions:  TAKE NITROGLYCERIN  AS NEEDED.  Lab Work: NUTRITIONAL THERAPIST AND CBC TO BE DONE TODAY.  Testing/Procedures:  [facility] A DEPT OF [place]. [facility] AT [address] A DEPT OF THE [place]. [facility] [address] [place] [address] Dept: [phone] Loc: [phone]  Savas Elvin.  09/27/2024  You are scheduled for a Cardiac Catheterization on Wednesday, February 11 with Dr. Gordy Bergamo.  1. Please arrive at the [facility] (Main Entrance A) at [facility]: [address] [place], [place] [address] at 6:30 AM (This time is 2 hour(s) before your procedure to ensure your preparation).   Free valet parking service is available. You will check in at ADMITTING. The support person will be asked to wait in the waiting room.  It is OK to have someone drop you off and come back when you are ready to be discharged.    Special note: Every effort is made to have your procedure done on time. Please understand that emergencies sometimes delay scheduled procedures.  2. Diet: Nothing to eat after midnight.   3. Hydration: You need to be well hydrated before your procedure. On February 11, you may drink approved liquids (see below) until 2 hours before the procedure, with 16 oz of water as your last intake.   List of approved liquids water, clear juice, clear tea, black coffee, fruit juices, non-citric and without pulp, carbonated beverages, Gatorade, Kool -Aid, plain Jello-O and plain ice popsicles.  4. Labs: You will need to have blood drawn on Thursday, September 27, 2024 at [facility] (1st Floor), [address] [place], [place] [address]. You do not need to be fasting.  5. Medication instructions in preparation for your procedure:   Contrast Allergy: No   Stop taking, Diovan (Valsartan) Tuesday, October 02, 2024. (TAKE LAST DOSE ON MONDAY)   On the morning of your procedure,  take your Aspirin 81 mg and any morning medicines NOT listed above.  You may use sips of water.  6. Plan to go home the same day, you will only stay overnight if medically necessary. 7. Bring a current list of your medications and current insurance cards. 8. You MUST have a responsible person to drive you home. 9. Someone MUST be with you the first 24 hours after you arrive home or your discharge will be delayed. 10. Please wear clothes that are easy to get on and off and wear slip-on shoes.  Thank you for allowing us  to care for you!   -- [place] [facility]   Follow-Up: At [facility], you and your health needs are our priority.  As part of our continuing mission to provide you with exceptional heart care, our providers are all part of one team.  This team includes your primary Cardiologist (physician) and Advanced Practice Providers or APPs (Physician Assistants and Nurse Practitioners) who all work together to provide you with the care you need, when you need it.  Your next appointment:   KEEP 12/13/24 APPOINTMENT AT 8:40 AM WITH DR. KATE, MD  Provider:   DR. LONNI KATE, MD  We recommend signing up for the patient portal called MyChart.  Sign up information is provided on this After Visit Summary.  MyChart is used to connect with patients for Virtual Visits (Telemedicine).  Patients are able to view lab/test results, encounter notes, upcoming appointments, etc.  Non-urgent messages  can be sent to your provider as well.   To learn more about what you can do with MyChart, go to [email or web address].   Other Instructions:

## 2024-09-27 NOTE — Progress Notes (Signed)
 " Cardiology Office Note:    Date:  09/27/2024   ID:  Spencer JULIANNA Spencer Lloyd., DOB 23-Oct-1945, MRN [id number]  PCP:  Spencer Opal, DO  Cardiologist:  None  Electrophysiologist:  None   Referring MD: Spencer Opal, DO   Chief Complaint  Patient presents with   Follow-up    Discuss abnormal CCTA.   Coronary Artery Disease    History of Present Illness:    Spencer Lloyd. is a 79 y.o. male with a hx of hypertension, hyperlipidemia, asthma who presents for follow-up.  He was referred by Dr. Auston for evaluation of recurrent syncope, initially seen 09/14/2024.  He reports history of multiple syncopal episodes, occurs every few years and has been happening since 72s.  Reports most recent episode was day after Christmas, was driving in passenger seat.  Started sweating and felt lightheaded and then within 5 minutes passed out.  States episodes are always similar to this, will have diaphoresis and lightheadedness and then pass out.  Typically when he feels the symptoms he will lie on the floor and it will improve but sometimes he is unable to do so.  States that about once per week will start having the symptoms but lie down and it improves.  When ambulance arrived during recent episode, his heart rate was in the 40s.  He wore a cardiac monitor with his PCP, do not have results.  Also reports chest pain that he describes as sharp pain lasting short duration, can occur at rest or with exertion.  Denies any dyspnea, edema, or palpitations.  Reports he smoked in high school.  Family history includes father died suddenly at 80 and brother died suddenly at 45.  Echocardiogram 09/23/2022 shows EF 60 to 65%, normal RV function, no significant valvular disease.  Carotid duplex 09/23/2022 showed near normal vessels with only minimal wall thickening or plaque.  Coronary CTA 09/21/2024 was read as severe three-vessel CAD but on review of CT FFR appears gradual tapering in LAD and RCA, but severe stenosis in  proximal OM1 (CT FFR 0.64), calcium score 508 (64th percentile).  Since last clinic visit, he reports he continues to have intermittent chest pain.  States that occurs every 1 to 2 days, typically lasts for short duration.  Also having some shortness of breath.  Denies any further syncopal episodes but does report some lightheadedness.   Past Medical History:  Diagnosis Date   Benign localized prostatic hyperplasia with lower urinary tract symptoms (LUTS)    Diverticulosis    GERD (gastroesophageal reflux disease)    watch diet   Glaucoma, both eyes    History of adenomatous polyp of colon    History of syncope    per pt has hx of syncopal episodes due to orthostatic hypotension,  last one 04/ 2019 and before that 2017   Hypercholesteremia    Hypertension    Mild intermittent asthma    Personal history of malignant melanoma 1998   s/p  wide local excision from mid lower back ,  per pt sln's negative and no recurrence   Prediabetes    Wears glasses     Past Surgical History:  Procedure Laterality Date   COLONOSCOPY  2011   MELANOMA EXCISION WITH SENTINEL LYMPH NODE BIOPSY  1998   from mid back ,  axillia nodes   TRANSURETHRAL RESECTION OF PROSTATE N/A 07/06/2018   Procedure: TRANSURETHRAL RESECTION OF THE PROSTATE (TURP);  Surgeon: Watt Rush, MD;  Location: [facility];  Service: Urology;  Laterality: N/A;    Current Medications: Active Medications[1]   Allergies:   Patient has no known allergies.   Social History   Socioeconomic History   Marital status: Married    Spouse name: Not on file   Number of children: Not on file   Years of education: Not on file   Highest education level: 12th grade  Occupational History   Not on file  Tobacco Use   Smoking status: Former    Types: Cigarettes   Smokeless tobacco: Never   Tobacco comments:    Pt smoked 60 years ago in high school  Vaping Use   Vaping status: Never Used  Substance and Sexual Activity   Alcohol use: Yes     Alcohol/week: 0.0 standard drinks of alcohol    Comment: occas.   Drug use: Never   Sexual activity: Not on file    Comment: vasectomy  Other Topics Concern   Not on file  Social History Narrative   Not on file   Social Drivers of Health   Tobacco Use: Medium Risk (09/27/2024)   Patient History    Smoking Tobacco Use: Former    Smokeless Tobacco Use: Never    Passive Exposure: Not on file  Financial Resource Strain: Low Risk (09/26/2024)   Overall Financial Resource Strain (CARDIA)    Difficulty of Paying Living Expenses: Not hard at all  Food Insecurity: No Food Insecurity (09/26/2024)   Epic    Worried About Radiation Protection Practitioner of Food in the Last Year: Never true    Ran Out of Food in the Last Year: Never true  Transportation Needs: Unknown (09/26/2024)   Epic    Lack of Transportation (Medical): No    Lack of Transportation (Non-Medical): Not on file  Physical Activity: Sufficiently Active (09/26/2024)   Exercise Vital Sign    Days of Exercise per Week: 4 days    Minutes of Exercise per Session: 40 min  Stress: No Stress Concern Present (09/26/2024)   [employer] of Occupational Health - Occupational Stress Questionnaire    Feeling of Stress: Only a little  Social Connections: Unknown (09/26/2024)   Social Connection and Isolation Panel    Frequency of Communication with Friends and Family: Twice a week    Frequency of Social Gatherings with Friends and Family: Patient declined    Attends Religious Services: Patient declined    Database Administrator or Organizations: No    Attends Engineer, Structural: Not on file    Marital Status: Married  Depression (EYV7-0): Not on file  Alcohol Screen: Low Risk (09/26/2024)   Alcohol Screen    Last Alcohol Screening Score (AUDIT): 3  Housing: Unknown (09/26/2024)   Epic    Unable to Pay for Housing in the Last Year: No    Number of Times Moved in the Last Year: Not on file    Homeless in the Last Year: No  Utilities: Not on file   Health Literacy: Not on file     Family History: The patient's family history is not on file.  ROS:   Please see the history of present illness.     All other systems reviewed and are negative.  EKGs/Labs/Other Studies Reviewed:    The following studies were reviewed today:   EKG:   09/14/2024: Sinus bradycardia, rate 51, incomplete right bundle branch block  Recent Labs: 09/14/2024: BUN 13; Creatinine, Ser 1.20; Potassium 4.1; Sodium 139  Recent Lipid Panel No results found for: CHOL, TRIG,  HDL, CHOLHDL, VLDL, LDLCALC, LDLDIRECT  Physical Exam:    VS:  BP 124/62 (BP Location: Left Arm, Patient Position: Sitting, Cuff Size: Normal)   Pulse (!) 56   Ht 5' 7 (1.702 m)   Wt 162 lb (73.5 kg)   BMI 25.37 kg/m     Wt Readings from Last 3 Encounters:  09/27/24 162 lb (73.5 kg)  09/14/24 161 lb (73 kg)  07/06/24 161 lb 12.8 oz (73.4 kg)     GEN:  Well nourished, well developed in no acute distress HEENT: Normal NECK: No JVD; No carotid bruits LYMPHATICS: No lymphadenopathy CARDIAC: RRR, no murmurs, rubs, gallops RESPIRATORY:  Clear to auscultation without rales, wheezing or rhonchi  ABDOMEN: Soft, non-tender, non-distended MUSCULOSKELETAL:  No edema; No deformity  SKIN: Warm and dry NEUROLOGIC:  Alert and oriented x 3 PSYCHIATRIC:  Normal affect   ASSESSMENT:    1. Coronary artery disease of native artery of native heart with stable angina pectoris   2. Syncope, unspecified syncope type   3. Essential hypertension   4. Hyperlipidemia, unspecified hyperlipidemia type   5. Medication management     PLAN:    CAD: Reports atypical chest pain but does state can occur with exertion, suggesting possible angina.  Coronary CTA 09/21/2024 was read as severe three-vessel CAD but on review of CT FFR appears gradual tapering in LAD and RCA, but severe stenosis in proximal OM1 (CT FFR 0.64), calcium score 508 (64th percentile). - Continue aspirin 81 mg daily -  Continue rosuvastatin 40 mg daily - As needed sublingual nitroglycerin  - Recommend LHC.  Risks and benefits of cardiac catheterization have been discussed with the patient.  These include bleeding, infection, kidney damage, stroke, heart attack, death.  The patient understands these risks and is willing to proceed.  Recurrent syncope: Description suggests vasovagal syncope, as describes prodromal symptoms preceding syncopal episodes.  Discussed staying well-hydrated and using compression stockings and if having prodromal symptoms lying on the ground.  Echocardiogram in 2024 showed no structural abnormalities.  Reports recently wore cardiac monitor, will obtain records from his PCP  Hypertension: On amlodipine  10 mg daily and valsartan 80 mg daily.    Hyperlipidemia: On rosuvastatin 40 mg daily.  LDL 64 on 04/2024  RTC in 2 months  Medication Adjustments/Labs and Tests Ordered: Current medicines are reviewed at length with the patient today.  Concerns regarding medicines are outlined above.  Orders Placed This Encounter  Procedures   CBC   Basic metabolic panel with GFR   Meds ordered this encounter  Medications   nitroGLYCERIN  (NITROSTAT ) 0.4 MG SL tablet    Sig: Place 1 tablet (0.4 mg total) under the tongue every 5 (five) minutes as needed for chest pain.    Dispense:  25 tablet    Refill:  11    Patient Instructions  Medication Instructions:  TAKE NITROGLYCERIN  AS NEEDED.  Lab Work: NUTRITIONAL THERAPIST AND CBC TO BE DONE TODAY.  Testing/Procedures:  [facility] OF [place] [address] A DEPT OF THE [place] [address] [place] [address] Dept: [phone] Loc: [phone]  Danyell Awbrey.  09/27/2024  You are scheduled for a Cardiac Catheterization on Wednesday, February 11 with Dr. Gordy Bergamo.  1. Please arrive at the [facility] (Main Entrance A) at [facility]: [address] [place], [place]  [address] at 6:30 AM (This time is 2 hour(s) before your procedure to ensure  your preparation).   Free valet parking service is available. You will check in at ADMITTING. The support person will be asked to wait in the waiting room.  It is OK to have someone drop you off and come back when you are ready to be discharged.    Special note: Every effort is made to have your procedure done on time. Please understand that emergencies sometimes delay scheduled procedures.  2. Diet: Nothing to eat after midnight.   3. Hydration: You need to be well hydrated before your procedure. On February 11, you may drink approved liquids (see below) until 2 hours before the procedure, with 16 oz of water as your last intake.   List of approved liquids water, clear juice, clear tea, black coffee, fruit juices, non-citric and without pulp, carbonated beverages, Gatorade, Kool -Aid, plain Jello-O and plain ice popsicles.  4. Labs: You will need to have blood drawn on Thursday, September 27, 2024 at [facility] (1st Floor), [address], [place], [place] [address]. You do not need to be fasting.  5. Medication instructions in preparation for your procedure:   Contrast Allergy: No   Stop taking, Diovan (Valsartan) Tuesday, October 02, 2024. (TAKE LAST DOSE ON MONDAY)   On the morning of your procedure, take your Aspirin 81 mg and any morning medicines NOT listed above.  You may use sips of water.  6. Plan to go home the same day, you will only stay overnight if medically necessary. 7. Bring a current list of your medications and current insurance cards. 8. You MUST have a responsible person to drive you home. 9. Someone MUST be with you the first 24 hours after you arrive home or your discharge will be delayed. 10. Please wear clothes that are easy to get on and off and wear slip-on shoes.  Thank you for allowing us  to care for you!   -- [facility]   Follow-Up: At [facility], you and your health needs are our priority.  As part of our continuing mission to provide you with exceptional heart care, our providers are all part of one team.  This team includes your primary Cardiologist (physician) and Advanced Practice Providers or APPs (Physician Assistants and Nurse Practitioners) who all work together to provide you with the care you need, when you need it.  Your next appointment:   KEEP 12/13/24 APPOINTMENT AT 8:40 AM WITH DR. KATE, MD  Provider:   DR. LONNI KATE, MD  We recommend signing up for the patient portal called MyChart.  Sign up information is provided on this After Visit Summary.  MyChart is used to connect with patients for Virtual Visits (Telemedicine).  Patients are able to view lab/test results, encounter notes, upcoming appointments, etc.  Non-urgent messages can be sent to your provider as well.   To learn more about what you can do with MyChart, go to [email or web address].   Other Instructions:             Signed, Lonni LITTIE Kate, MD  09/27/2024 12:41 PM    [facility]     [1]  Current Meds  Medication Sig   albuterol  (VENTOLIN  HFA) 108 (90 Base) MCG/ACT inhaler INHALE 2 PUFFS BY MOUTH EVERY 4 HOURS ASNEEDED FOR WHEEZING OR SHORTNESS OF BREATH   ALPHAGAN  P 0.1 % SOLN Place 1 drop into both eyes 2 (two) times daily.    amLODipine  (  NORVASC ) 10 MG tablet Take 1 tablet (10 mg total) by mouth daily.   aspirin EC 81 MG tablet Take 81 mg by mouth daily. Swallow whole.   Cetirizine HCl (ZYRTEC PO) Take by mouth.   dorzolamide -timolol  (COSOPT ) 22.3-6.8 MG/ML ophthalmic solution Place 1 drop into both eyes 2 (two) times daily.    famotidine (PEPCID) 20 MG tablet 1 tablet   latanoprost  (XALATAN ) 0.005 % ophthalmic solution    montelukast  (SINGULAIR ) 10 MG tablet TAKE 1 TABLET BY MOUTH DAILY AT BEDTIME   nitroGLYCERIN  (NITROSTAT ) 0.4  MG SL tablet Place 1 tablet (0.4 mg total) under the tongue every 5 (five) minutes as needed for chest pain.   rosuvastatin (CRESTOR) 40 MG tablet Take 40 mg by mouth daily.   SYMBICORT  80-4.5 MCG/ACT inhaler Inhale 2 puffs into the lungs 2 (two) times daily.   valsartan (DIOVAN) 80 MG tablet Take 80 mg by mouth daily.   "

## 2024-09-28 ENCOUNTER — Ambulatory Visit: Payer: Self-pay | Admitting: Cardiology

## 2024-09-28 LAB — BASIC METABOLIC PANEL WITH GFR
BUN/Creatinine Ratio: 13 (ref 10–24)
BUN: 13 mg/dL (ref 8–27)
CO2: 25 mmol/L (ref 20–29)
Calcium: 9.7 mg/dL (ref 8.6–10.2)
Chloride: 103 mmol/L (ref 96–106)
Creatinine, Ser: 1.03 mg/dL (ref 0.76–1.27)
Glucose: 103 mg/dL — ABNORMAL HIGH (ref 70–99)
Potassium: 4.5 mmol/L (ref 3.5–5.2)
Sodium: 141 mmol/L (ref 134–144)
eGFR: 75 mL/min/{1.73_m2}

## 2024-09-28 LAB — CBC
Hematocrit: 47 (ref 37.5–51.0)
Hemoglobin: 15.6 g/dL (ref 13.0–17.7)
MCH: 30.1 pg (ref 26.6–33.0)
MCHC: 33.2 g/dL (ref 31.5–35.7)
MCV: 91 fL (ref 79–97)
Platelets: 204 10*3/uL (ref 150–450)
RBC: 5.19 x10E6/uL (ref 4.14–5.80)
RDW: 12.6 (ref 11.6–15.4)
WBC: 7.6 10*3/uL (ref 3.4–10.8)

## 2024-10-03 ENCOUNTER — Encounter (HOSPITAL_COMMUNITY): Admission: RE | Payer: Self-pay | Source: Home / Self Care

## 2024-10-03 ENCOUNTER — Ambulatory Visit (HOSPITAL_COMMUNITY): Admission: RE | Admit: 2024-10-03 | Source: Home / Self Care | Admitting: Cardiology

## 2024-10-31 ENCOUNTER — Institutional Professional Consult (permissible substitution) (INDEPENDENT_AMBULATORY_CARE_PROVIDER_SITE_OTHER)

## 2024-12-13 ENCOUNTER — Ambulatory Visit: Admitting: Cardiology
